# Patient Record
Sex: Male | Born: 1960
Health system: Southern US, Community
[De-identification: ages and names within clinical notes are randomized; demographics above are authoritative.]

## PROBLEM LIST (undated history)

## (undated) DIAGNOSIS — E785 Hyperlipidemia, unspecified: Secondary | ICD-10-CM

## (undated) DIAGNOSIS — I10 Essential (primary) hypertension: Secondary | ICD-10-CM

## (undated) HISTORY — PX: SHOULDER SURGERY: SHX246

## (undated) HISTORY — PX: HERNIA REPAIR: SHX51

---

## 2007-01-06 DIAGNOSIS — G4700 Insomnia, unspecified: Secondary | ICD-10-CM | POA: Insufficient documentation

## 2007-01-11 DIAGNOSIS — E785 Hyperlipidemia, unspecified: Secondary | ICD-10-CM | POA: Insufficient documentation

## 2007-01-11 DIAGNOSIS — IMO0002 Reserved for concepts with insufficient information to code with codable children: Secondary | ICD-10-CM | POA: Insufficient documentation

## 2007-05-20 ENCOUNTER — Emergency Department: Payer: Self-pay | Admitting: Emergency Medicine

## 2008-10-21 DIAGNOSIS — Z8249 Family history of ischemic heart disease and other diseases of the circulatory system: Secondary | ICD-10-CM | POA: Insufficient documentation

## 2012-02-02 ENCOUNTER — Emergency Department: Payer: Self-pay | Admitting: Emergency Medicine

## 2012-04-02 ENCOUNTER — Ambulatory Visit: Payer: Self-pay | Admitting: Emergency Medicine

## 2012-04-03 ENCOUNTER — Ambulatory Visit: Payer: Self-pay | Admitting: Emergency Medicine

## 2012-09-26 ENCOUNTER — Emergency Department: Payer: Self-pay | Admitting: Emergency Medicine

## 2013-01-13 ENCOUNTER — Emergency Department: Payer: Self-pay | Admitting: Internal Medicine

## 2014-05-08 ENCOUNTER — Ambulatory Visit: Payer: Self-pay | Admitting: Family Medicine

## 2014-05-08 LAB — CBC AND DIFFERENTIAL
HCT: 43 % (ref 41–53)
HEMOGLOBIN: 15 g/dL (ref 13.5–17.5)
Neutrophils Absolute: 3 /uL
Platelets: 253 10*3/uL (ref 150–399)
WBC: 5.7 10*3/mL

## 2014-05-08 LAB — LIPID PANEL
CHOLESTEROL: 216 mg/dL — AB (ref 0–200)
HDL: 71 mg/dL — AB (ref 35–70)
LDL Cholesterol: 122 mg/dL
Triglycerides: 117 mg/dL (ref 40–160)

## 2014-05-08 LAB — BASIC METABOLIC PANEL
BUN: 13 mg/dL (ref 4–21)
Creatinine: 1.3 mg/dL (ref 0.6–1.3)
Glucose: 92 mg/dL
POTASSIUM: 5 mmol/L (ref 3.4–5.3)
SODIUM: 138 mmol/L (ref 137–147)

## 2014-05-08 LAB — HEPATIC FUNCTION PANEL
ALT: 60 U/L — AB (ref 10–40)
AST: 45 U/L — AB (ref 14–40)
Alkaline Phosphatase: 57 U/L (ref 25–125)
Bilirubin, Total: 0.2 mg/dL

## 2014-07-11 NOTE — Op Note (Signed)
PATIENT NAME:  GARRIT, MARROW MR#:  [id number] DATE OF BIRTH:  [birth year]  DATE OF PROCEDURE:  04/03/2012  PREOPERATIVE DIAGNOSIS: Incarcerated ventral hernia.   POSTOPERATIVE DIAGNOSIS: Incarcerated ventral hernia.   PROCEDURES PERFORMED: 1. Repair of incarcerated ventral hernia.  2. Insertion of mesh.   SURGEON: Vella Kohler, MD  INDICATION: This is a patient who was having pain in the umbilical area and had an incarcerated hernia.   DESCRIPTION OF PROCEDURE: The patient was then brought to surgery. After he was put to sleep, a transverse incision was made. After cutting skin and subcutaneous tissue, the hernia was coming below the umbilicus. The umbilicus itself was okay. It was detached anyway and hernia sac was then removed, with 0 Vicryl sutures, after dissecting out from the peritoneum. There was a piece of omentum which was stuck. After that I took a piece of mesh and put it in the abdomen, brought out from the incision and then sutured with 0 Surgilon interrupted sutures. After this repair was done, which was very good, subcuticular closure with 3-0 Vicryl and 4-0 Vicryl was done and glue was applied. The patient tolerated the procedure well and was sent to the recovery room in satisfactory condition.  ____________________________ Welford Roche Phylis Bougie, MD msh:sb D: 04/03/2012 13:19:15 ET T: 04/03/2012 13:25:31 ET JOB#: [id number]  cc: Samul Mcinroy S. Phylis Bougie, MD, <Dictator> Meindert A. Brunetta Genera, MD Sharene Butters MD ELECTRONICALLY SIGNED 04/03/2012 16:27

## 2015-04-21 ENCOUNTER — Emergency Department
Admission: EM | Admit: 2015-04-21 | Discharge: 2015-04-21 | Disposition: A | Discharge status: Home / Self Care | Payer: 59 | Attending: Emergency Medicine | Admitting: Emergency Medicine

## 2015-04-21 ENCOUNTER — Encounter: Payer: Self-pay | Admitting: Emergency Medicine

## 2015-04-21 DIAGNOSIS — L02416 Cutaneous abscess of left lower limb: Secondary | ICD-10-CM | POA: Diagnosis present

## 2015-04-21 MED ORDER — LIDOCAINE HCL (PF) 1 % IJ SOLN
INTRAMUSCULAR | Status: AC
  Administered 2015-04-21: 15:00:00
  Filled 2015-04-21: qty 5

## 2015-04-21 MED ORDER — OXYCODONE-ACETAMINOPHEN 5-325 MG PO TABS
1.0000 | ORAL_TABLET | ORAL | Status: DC | PRN
Start: 2015-04-21 — End: 2015-05-18

## 2015-04-21 MED ORDER — SULFAMETHOXAZOLE-TRIMETHOPRIM 800-160 MG PO TABS: 1.0000 | ORAL_TABLET | Freq: Two times a day (BID) | ORAL | Status: DC

## 2015-04-21 NOTE — ED Provider Notes (Signed)
[facility] Emergency Department Provider Note  ____________________________________________  Time seen: Approximately 1:55 PM  I have reviewed the triage vital signs and the nursing notes.   HISTORY  Chief Complaint Leg Pain    HPI Anthony Hood is a 55 y.o. male presents for evaluation of abscess to his left upper thigh. He thinks a thorn might actually be stuck in his thigh or punctured Causing infection. Pain is considered 9/10 and nonradiating at this time. Relief with over-the-counter medications   History reviewed. No pertinent past medical history.  There are no active problems to display for this patient.   History reviewed. No pertinent past surgical history.  Current Outpatient Rx  Name  Route  Sig  Dispense  Refill  . oxyCODONE-acetaminophen (ROXICET) 5-325 MG tablet   Oral   Take 1-2 tablets by mouth every 4 (four) hours as needed for severe pain.   15 tablet   0   . sulfamethoxazole-trimethoprim (BACTRIM DS,SEPTRA DS) 800-160 MG tablet   Oral   Take 1 tablet by mouth 2 (two) times daily.   20 tablet   0     Allergies Review of patient's allergies indicates no known allergies.  History reviewed. No pertinent family history.  Social History Social History  Substance Use Topics  . Smoking status: Never Smoker   . Smokeless tobacco: None  . Alcohol Use: No    Review of Systems Constitutional: No fever/chills Eyes: No visual changes. ENT: No sore throat. Cardiovascular: Denies chest pain. Respiratory: Denies shortness of breath. Gastrointestinal: No abdominal pain.  No nausea, no vomiting.  No diarrhea.  No constipation. Genitourinary: Negative for dysuria. Musculoskeletal: Negative for back pain. Skin: Positive for erythema redness and tenderness. Neurological: Negative for headaches, focal weakness or numbness.  10-point ROS otherwise negative.  ____________________________________________   PHYSICAL  EXAM:  VITAL SIGNS: ED Triage Vitals  Enc Vitals Group     BP 04/21/15 1333 130/87 mmHg     Pulse Rate 04/21/15 1333 100     Resp 04/21/15 1333 15     Temp 04/21/15 1333 98.1 F (36.7 C)     Temp Source 04/21/15 1333 Oral     SpO2 04/21/15 1333 100 %     Weight 04/21/15 1333 201 lb (91.173 kg)     Height 04/21/15 1333 6\' 2"  (1.88 m)     Head Cir --      Peak Flow --      Pain Score 04/21/15 1330 9     Pain Loc --      Pain Edu? --      Excl. in [place]? --     Constitutional: Alert and oriented. Well appearing and in no acute distress. Cardiovascular: Normal rate, regular rhythm. Grossly normal heart sounds.  Good peripheral circulation. Respiratory: Normal respiratory effort.  No retractions. Lungs CTAB. Musculoskeletal: No lower extremity tenderness nor edema.  No joint effusions. Neurologic:  Normal speech and language. No gross focal neurologic deficits are appreciated. No gait instability. Skin:  Skin is warm and dry with a 10 cm erythematous area erythema and extreme warmth. A 2 cm punctate middle noted. With pustular fluid. Psychiatric: Mood and affect are normal. Speech and behavior are normal.  ____________________________________________   LABS (all labs ordered are listed, but only abnormal results are displayed)  Labs Reviewed - No data to display   RADIOLOGY  Deferred at this visit ____________________________________________   PROCEDURES  Procedure(s) performed: [place] Performed by: Arlyss Repress Consent:  Verbal consent obtained. Risks and benefits: risks, benefits and alternatives were discussed Type: abscess  Body area: Left upper thigh  Anesthesia: local infiltration  Incision was made with a scalpel.  Local anesthetic: lidocaine 1 % without epinephrine  Anesthetic total: 4 ml  Complexity: complex Blunt dissection to break up loculations  Drainage: purulent  Drainage amount: Moderate   Packing material: 1/4 in  iodoform gauze  Patient tolerance: Patient tolerated the procedure well with no immediate complications.      Critical Care performed: No  ____________________________________________   INITIAL IMPRESSION / ASSESSMENT AND PLAN / ED COURSE  Pertinent labs & imaging results that were available during my care of the patient were reviewed by me and considered in my medical decision making (see chart for details).  Abscess to left upper thigh. Rx given for Bactrim DS twice a day 20 and Percocet 5/325. Procedure performed as noted above. Patient to return in 48 hours for packing recheck. ____________________________________________   FINAL CLINICAL IMPRESSION(S) / ED DIAGNOSES  Final diagnoses:  Abscess of left thigh     This chart was dictated using voice recognition software/Dragon. Despite best efforts to proofread, errors can occur which can change the meaning. Any change was purely unintentional.   Arlyss Repress, PA-C 04/21/15 [place], MD 04/21/15 [phone]

## 2015-04-21 NOTE — ED Notes (Signed)
Pt to ed with c/o upper left leg pain x 2 days.  Pt states he thinks there may be a thorn in his leg.

## 2015-04-21 NOTE — Discharge Instructions (Signed)
Incision and Drainage °Incision and drainage is a procedure in which a sac-like structure (cystic structure) is opened and drained. The area to be drained usually contains material such as pus, fluid, or blood.  °LET YOUR CAREGIVER KNOW ABOUT:  °· Allergies to medicine. °· Medicines taken, including vitamins, herbs, eyedrops, over-the-counter medicines, and creams. °· Use of steroids (by mouth or creams). °· Previous problems with anesthetics or numbing medicines. °· History of bleeding problems or blood clots. °· Previous surgery. °· Other health problems, including diabetes and kidney problems. °· Possibility of pregnancy, if this applies. °RISKS AND COMPLICATIONS °· Pain. °· Bleeding. °· Scarring. °· Infection. °BEFORE THE PROCEDURE  °You may need to have an ultrasound or other imaging tests to see how large or deep your cystic structure is. Blood tests may also be used to determine if you have an infection or how severe the infection is. You may need to have a tetanus shot. °PROCEDURE  °The affected area is cleaned with a cleaning fluid. The cyst area will then be numbed with a medicine (local anesthetic). A small incision will be made in the cystic structure. A syringe or catheter may be used to drain the contents of the cystic structure, or the contents may be squeezed out. The area will then be flushed with a cleansing solution. After cleansing the area, it is often gently packed with a gauze or another wound dressing. Once it is packed, it will be covered with gauze and tape or some other type of wound dressing.  °AFTER THE PROCEDURE  °· Often, you will be allowed to go home right after the procedure. °· You may be given antibiotic medicine to prevent or heal an infection. °· If the area was packed with gauze or some other wound dressing, you will likely need to come back in 1 to 2 days to get it removed. °· The area should heal in about 14 days. °  °This information is not intended to replace advice given  to you by your health care provider. Make sure you discuss any questions you have with your health care provider. °  °Document Released: 08/31/2000 Document Revised: 09/06/2011 Document Reviewed: 05/02/2011 °Elsevier Interactive Patient Education ©2016 Elsevier Inc. ° °Abscess °An abscess is an infected area that contains a collection of pus and debris. It can occur in almost any part of the body. An abscess is also known as a furuncle or boil. °CAUSES  °An abscess occurs when tissue gets infected. This can occur from blockage of oil or sweat glands, infection of hair follicles, or a minor injury to the skin. As the body tries to fight the infection, pus collects in the area and creates pressure under the skin. This pressure causes pain. People with weakened immune systems have difficulty fighting infections and get certain abscesses more often.  °SYMPTOMS °Usually an abscess develops on the skin and becomes a painful mass that is red, warm, and tender. If the abscess forms under the skin, you may feel a moveable soft area under the skin. Some abscesses break open (rupture) on their own, but most will continue to get worse without care. The infection can spread deeper into the body and eventually into the bloodstream, causing you to feel ill.  °DIAGNOSIS  °Your caregiver will take your medical history and perform a physical exam. A sample of fluid may also be taken from the abscess to determine what is causing your infection. °TREATMENT  °Your caregiver may prescribe antibiotic medicines to fight the infection.   However, taking antibiotics alone usually does not cure an abscess. Your caregiver may need to make a small cut (incision) in the abscess to drain the pus. In some cases, gauze is packed into the abscess to reduce pain and to continue draining the area. °HOME CARE INSTRUCTIONS  °· Only take over-the-counter or prescription medicines for pain, discomfort, or fever as directed by your caregiver. °· If you were  prescribed antibiotics, take them as directed. Finish them even if you start to feel better. °· If gauze is used, follow your caregiver's directions for changing the gauze. °· To avoid spreading the infection: °¨ Keep your draining abscess covered with a bandage. °¨ Wash your hands well. °¨ Do not share personal care items, towels, or whirlpools with others. °¨ Avoid skin contact with others. °· Keep your skin and clothes clean around the abscess. °· Keep all follow-up appointments as directed by your caregiver. °SEEK MEDICAL CARE IF:  °· You have increased pain, swelling, redness, fluid drainage, or bleeding. °· You have muscle aches, chills, or a general ill feeling. °· You have a fever. °MAKE SURE YOU:  °· Understand these instructions. °· Will watch your condition. °· Will get help right away if you are not doing well or get worse. °  °This information is not intended to replace advice given to you by your health care provider. Make sure you discuss any questions you have with your health care provider. °  °Document Released: 12/15/2004 Document Revised: 09/06/2011 Document Reviewed: 05/20/2011 °Elsevier Interactive Patient Education ©2016 Elsevier Inc. ° °

## 2015-04-21 NOTE — ED Notes (Signed)
Pt with left thigh wound (abscess). Warm, red painful.

## 2015-04-24 ENCOUNTER — Emergency Department
Admission: EM | Admit: 2015-04-24 | Discharge: 2015-04-24 | Disposition: A | Discharge status: Home / Self Care | Payer: 59

## 2015-04-24 DIAGNOSIS — K921 Melena: Secondary | ICD-10-CM | POA: Insufficient documentation

## 2015-04-24 DIAGNOSIS — I10 Essential (primary) hypertension: Secondary | ICD-10-CM | POA: Insufficient documentation

## 2015-04-24 DIAGNOSIS — R748 Abnormal levels of other serum enzymes: Secondary | ICD-10-CM | POA: Insufficient documentation

## 2015-05-18 ENCOUNTER — Ambulatory Visit (INDEPENDENT_AMBULATORY_CARE_PROVIDER_SITE_OTHER): Payer: 59 | Admitting: Family Medicine

## 2015-05-18 ENCOUNTER — Encounter: Payer: Self-pay | Admitting: Family Medicine

## 2015-05-18 VITALS — BP 120/80 | HR 84 | Temp 97.7°F | Resp 16 | Ht 72.0 in | Wt 203.0 lb

## 2015-05-18 DIAGNOSIS — Z0000 Encounter for general adult medical examination without abnormal findings: Secondary | ICD-10-CM

## 2015-05-18 DIAGNOSIS — E785 Hyperlipidemia, unspecified: Secondary | ICD-10-CM

## 2015-05-18 DIAGNOSIS — Z1211 Encounter for screening for malignant neoplasm of colon: Secondary | ICD-10-CM

## 2015-05-18 DIAGNOSIS — I10 Essential (primary) hypertension: Secondary | ICD-10-CM

## 2015-05-18 MED ORDER — ATORVASTATIN CALCIUM 20 MG PO TABS: 20.0000 mg | ORAL_TABLET | Freq: Every day | ORAL | Status: DC

## 2015-05-18 MED ORDER — ENALAPRIL MALEATE 5 MG PO TABS: 5.0000 mg | ORAL_TABLET | Freq: Every day | ORAL | Status: DC

## 2015-05-18 NOTE — Progress Notes (Signed)
Patient ID: Anthony Hood, male   DOB: [birth year], 55 y.o.   MRN: [id number]       Patient: Anthony Hood, Male    DOB: [birth year], 55 y.o.   MRN: [id number] Visit Date: 05/18/2015  Today's Provider: Margarita Rana, MD   Chief Complaint  Patient presents with  . Medicare Wellness   Subjective:    Annual wellness visit Anthony Hood is a 55 y.o. male. He feels well. He reports exercising 2 days a week 4. He reports he is sleeping well.  Did have adenoma and hyperplastic polyp.  Does not know when due for next colonoscopy.    -----------------------------------------------------------   Review of Systems  Constitutional: Negative.   HENT: Negative.   Eyes: Negative.   Respiratory: Negative.   Cardiovascular: Negative.   Gastrointestinal: Negative.   Endocrine: Negative.   Genitourinary: Negative.   Musculoskeletal: Negative.   Skin: Negative.   Allergic/Immunologic: Negative.   Neurological: Negative.   Hematological: Negative.   Psychiatric/Behavioral: Negative.     Social History   Social History  . Marital Status: Married    Spouse Name: N/A  . Number of Children: N/A  . Years of Education: N/A   Occupational History  . Not on file.   Social History Main Topics  . Smoking status: Never Smoker   . Smokeless tobacco: Never Used  . Alcohol Use: 9.0 oz/week    15 Cans of beer per week  . Drug Use: No  . Sexual Activity: Not on file   Other Topics Concern  . Not on file   Social History Narrative    History reviewed. No pertinent past medical history.   Patient Active Problem List   Diagnosis Date Noted  . Blood in feces 04/24/2015  . Abnormal liver enzymes 04/24/2015  . Essential (primary) hypertension 04/24/2015  . Fam hx-ischem heart disease 10/21/2008  . Elevation of level of transaminase or lactic acid dehydrogenase (LDH) 01/11/2007  . HLD (hyperlipidemia) 01/11/2007  . Cannot sleep 01/06/2007    Past Surgical History  Procedure  Laterality Date  . Hernia repair      umbilical  . Shoulder surgery      His family history includes Heart disease in his father; Hypertension in his sister; Seizures in his sister.    Previous Medications   ATORVASTATIN (LIPITOR) 20 MG TABLET    Take 20 mg by mouth daily at 6 PM.    ENALAPRIL (VASOTEC) 5 MG TABLET    Take 5 mg by mouth daily.     Patient Care Team: Lorelee Market, MD as PCP - General (Family Medicine)     Objective:   Vitals: BP 120/80 mmHg  Pulse 84  Temp(Src) 97.7 F (36.5 C)  Resp 16  Ht 6' (1.829 m)  Wt 203 lb (92.08 kg)  BMI 27.53 kg/m2  Physical Exam  Constitutional: He is oriented to person, place, and time. He appears well-developed and well-nourished.  HENT:  Head: Normocephalic and atraumatic.  Right Ear: External ear normal.  Left Ear: External ear normal.  Nose: Nose normal.  Mouth/Throat: Oropharynx is clear and moist.  Eyes: Conjunctivae and EOM are normal. Pupils are equal, round, and reactive to light.  Neck: Normal range of motion. Neck supple.  Cardiovascular: Normal rate, regular rhythm and normal heart sounds.   Pulmonary/Chest: Effort normal and breath sounds normal.  Abdominal: Soft. Bowel sounds are normal.  Musculoskeletal: Normal range of motion.  Neurological: He is alert and oriented to person, place, and time.  Skin: Skin is warm and dry.  Psychiatric: He has a normal mood and affect. His behavior is normal. Judgment and thought content normal.    Activities of Daily Living In your present state of health, do you have any difficulty performing the following activities: 05/18/2015  Hearing? N  Vision? Y  Difficulty concentrating or making decisions? N  Walking or climbing stairs? N  Dressing or bathing? N  Doing errands, shopping? N    Fall Risk Assessment Fall Risk  05/18/2015  Falls in the past year? No     Depression Screen PHQ 2/9 Scores 05/18/2015  PHQ - 2 Score 0    Cognitive Testing - 6-CIT   Correct? Score   What year is it? yes 0 0 or 4  What month is it? yes 0 0 or 3  Memorize:    Anthony Hood,  41,  [place] [address]  [place]      What time is it? (within 1 hour) yes 0 0 or 3  Count backwards from 20 yes 0 0, 2, or 4  Name the months of the year no 4 0, 2, or 4  Repeat name & address above no 2 0, 2, 4, 6, 8, or 10       TOTAL SCORE  6/28   Interpretation:  Normal  Normal (0-7) Abnormal (8-28)       Assessment & Plan:     Annual Wellness Visit  Reviewed patient's Family Medical History Reviewed and updated list of patient's medical providers Assessment of cognitive impairment was done Assessed patient's functional ability Established a written schedule for health screening [place] Completed and Reviewed  Exercise Activities and Dietary recommendations Goals    None     1. Medicare annual wellness visit, subsequent Stable as above.    2. Essential (primary) hypertension Condition is stable. Please continue current medication and  plan of care as noted.   - CBC with Differential/Platelet - Comprehensive metabolic panel - enalapril (VASOTEC) 5 MG tablet; Take 1 tablet (5 mg total) by mouth daily.  Dispense: 90 tablet; Refill: 3  3. HLD (hyperlipidemia) Condition is stable. Please continue current medication and  plan of care as noted.   - Lipid Panel With LDL/HDL Ratio - atorvastatin (LIPITOR) 20 MG tablet; Take 1 tablet (20 mg total) by mouth daily at 6 PM.  Dispense: 90 tablet; Refill: 3  4. Colon cancer screening History of abnormal colonoscopy in 2013.  Will refer for repeat.   - Ambulatory referral to Gastroenterology      Patient was seen and examined by Jerrell Belfast, MD, and note scribed by Lynford Humphrey, [place].   I have reviewed the document for accuracy and completeness and I agree with above. Jerrell Belfast, MD   Margarita Rana, MD    ------------------------------------------------------------------------------------------------------------

## 2015-05-19 DIAGNOSIS — I10 Essential (primary) hypertension: Secondary | ICD-10-CM | POA: Diagnosis not present

## 2015-05-19 DIAGNOSIS — E785 Hyperlipidemia, unspecified: Secondary | ICD-10-CM | POA: Diagnosis not present

## 2015-05-20 LAB — CBC WITH DIFFERENTIAL/PLATELET
Basophils Absolute: 0 x10E3/uL (ref 0.0–0.2)
Basos: 1 %
EOS (ABSOLUTE): 0.1 x10E3/uL (ref 0.0–0.4)
Eos: 2 %
Hematocrit: 40.4 % (ref 37.5–51.0)
Hemoglobin: 14 g/dL (ref 12.6–17.7)
Immature Grans (Abs): 0 x10E3/uL (ref 0.0–0.1)
Immature Granulocytes: 0 %
Lymphocytes Absolute: 1.4 x10E3/uL (ref 0.7–3.1)
Lymphs: 34 %
MCH: 29.8 pg (ref 26.6–33.0)
MCHC: 34.7 g/dL (ref 31.5–35.7)
MCV: 86 fL (ref 79–97)
Monocytes Absolute: 0.3 x10E3/uL (ref 0.1–0.9)
Monocytes: 8 %
Neutrophils Absolute: 2.3 x10E3/uL (ref 1.4–7.0)
Neutrophils: 55 %
Platelets: 249 x10E3/uL (ref 150–379)
RBC: 4.7 x10E6/uL (ref 4.14–5.80)
RDW: 13.7 % (ref 12.3–15.4)
WBC: 4.1 x10E3/uL (ref 3.4–10.8)

## 2015-05-20 LAB — COMPREHENSIVE METABOLIC PANEL WITH GFR
ALT: 37 IU/L (ref 0–44)
AST: 34 IU/L (ref 0–40)
Albumin/Globulin Ratio: 1.4 (ref 1.1–2.5)
Albumin: 4.2 g/dL (ref 3.5–5.5)
Alkaline Phosphatase: 50 IU/L (ref 39–117)
BUN/Creatinine Ratio: 10 (ref 9–20)
BUN: 12 mg/dL (ref 6–24)
Bilirubin Total: 0.4 mg/dL (ref 0.0–1.2)
CO2: 19 mmol/L (ref 18–29)
Calcium: 9.3 mg/dL (ref 8.7–10.2)
Chloride: 100 mmol/L (ref 96–106)
Creatinine, Ser: 1.15 mg/dL (ref 0.76–1.27)
GFR calc Af Amer: 82 mL/min/1.73
GFR calc non Af Amer: 71 mL/min/1.73
Globulin, Total: 3 g/dL (ref 1.5–4.5)
Glucose: 96 mg/dL (ref 65–99)
Potassium: 4.5 mmol/L (ref 3.5–5.2)
Sodium: 138 mmol/L (ref 134–144)
Total Protein: 7.2 g/dL (ref 6.0–8.5)

## 2015-05-20 LAB — LIPID PANEL WITH LDL/HDL RATIO
Cholesterol, Total: 275 mg/dL — ABNORMAL HIGH (ref 100–199)
HDL: 76 mg/dL (ref >39)
LDL Calculated: 172 mg/dL — ABNORMAL HIGH (ref 0–99)
LDl/HDL Ratio: 2.3 ratio units (ref 0.0–3.6)
TRIGLYCERIDES: 136 mg/dL (ref 0–149)
VLDL CHOLESTEROL CAL: 27 mg/dL (ref 5–40)

## 2015-05-22 ENCOUNTER — Telehealth: Payer: Self-pay

## 2015-05-22 NOTE — Telephone Encounter (Signed)
-----   Message from Margarita Rana, MD sent at 05/22/2015 12:26 PM EST ----- Labs stable except cholesterol is too high.  Please see if patient taking his medication regularly. Thanks.

## 2015-05-22 NOTE — Telephone Encounter (Signed)
Left message with wife for patient to call back.

## 2015-05-25 NOTE — Telephone Encounter (Signed)
Tammy asked for a nurse to call pt back to give him the results of his labs. Thanks TNP

## 2015-05-25 NOTE — Telephone Encounter (Signed)
Informed pt. States he is NOT taking medication regularly. Advised pt to take cholesterol medication every night to help lower lipids. Renaldo Fiddler, CMA

## 2015-07-17 ENCOUNTER — Encounter: Payer: Self-pay | Admitting: *Deleted

## 2015-07-20 ENCOUNTER — Encounter
Admission: RE | Disposition: A | Discharge status: Home / Self Care | Payer: Self-pay | Source: Ambulatory Visit | Attending: Unknown Physician Specialty

## 2015-07-20 ENCOUNTER — Ambulatory Visit: Payer: Medicare Other | Admitting: Certified Registered Nurse Anesthetist

## 2015-07-20 ENCOUNTER — Encounter: Payer: Self-pay | Admitting: *Deleted

## 2015-07-20 ENCOUNTER — Ambulatory Visit
Admission: RE | Admit: 2015-07-20 | Discharge: 2015-07-20 | Disposition: A | Discharge status: Home / Self Care | Payer: Medicare Other | Source: Ambulatory Visit | Attending: Unknown Physician Specialty | Admitting: Unknown Physician Specialty

## 2015-07-20 DIAGNOSIS — D124 Benign neoplasm of descending colon: Secondary | ICD-10-CM | POA: Insufficient documentation

## 2015-07-20 DIAGNOSIS — Z820 Family history of epilepsy and other diseases of the nervous system: Secondary | ICD-10-CM | POA: Insufficient documentation

## 2015-07-20 DIAGNOSIS — I10 Essential (primary) hypertension: Secondary | ICD-10-CM | POA: Diagnosis not present

## 2015-07-20 DIAGNOSIS — Z86010 Personal history of colonic polyps: Secondary | ICD-10-CM | POA: Diagnosis not present

## 2015-07-20 DIAGNOSIS — D125 Benign neoplasm of sigmoid colon: Secondary | ICD-10-CM | POA: Diagnosis not present

## 2015-07-20 DIAGNOSIS — K635 Polyp of colon: Secondary | ICD-10-CM | POA: Diagnosis not present

## 2015-07-20 DIAGNOSIS — K5730 Diverticulosis of large intestine without perforation or abscess without bleeding: Secondary | ICD-10-CM | POA: Diagnosis not present

## 2015-07-20 DIAGNOSIS — K5790 Diverticulosis of intestine, part unspecified, without perforation or abscess without bleeding: Secondary | ICD-10-CM | POA: Diagnosis not present

## 2015-07-20 DIAGNOSIS — Z79899 Other long term (current) drug therapy: Secondary | ICD-10-CM | POA: Diagnosis not present

## 2015-07-20 DIAGNOSIS — E785 Hyperlipidemia, unspecified: Secondary | ICD-10-CM | POA: Insufficient documentation

## 2015-07-20 DIAGNOSIS — D122 Benign neoplasm of ascending colon: Secondary | ICD-10-CM | POA: Diagnosis not present

## 2015-07-20 DIAGNOSIS — Z8249 Family history of ischemic heart disease and other diseases of the circulatory system: Secondary | ICD-10-CM | POA: Insufficient documentation

## 2015-07-20 DIAGNOSIS — Z1211 Encounter for screening for malignant neoplasm of colon: Secondary | ICD-10-CM | POA: Insufficient documentation

## 2015-07-20 DIAGNOSIS — K649 Unspecified hemorrhoids: Secondary | ICD-10-CM | POA: Diagnosis not present

## 2015-07-20 HISTORY — PX: COLONOSCOPY WITH PROPOFOL: SHX5780

## 2015-07-20 HISTORY — DX: Hyperlipidemia, unspecified: E78.5

## 2015-07-20 HISTORY — DX: Essential (primary) hypertension: I10

## 2015-07-20 LAB — SURGICAL PATHOLOGY

## 2015-07-20 SURGERY — COLONOSCOPY WITH PROPOFOL
Anesthesia: General

## 2015-07-20 MED ORDER — MIDAZOLAM HCL 2 MG/2ML IJ SOLN
INTRAMUSCULAR | Status: DC | PRN
  Administered 2015-07-20: 1 mg via INTRAVENOUS

## 2015-07-20 MED ORDER — SODIUM CHLORIDE 0.9 % IV SOLN
INTRAVENOUS | Status: DC
  Administered 2015-07-20: 14:00:00 via INTRAVENOUS

## 2015-07-20 MED ORDER — SODIUM CHLORIDE 0.9 % IV SOLN: INTRAVENOUS | Status: DC

## 2015-07-20 MED ORDER — PROPOFOL 10 MG/ML IV BOLUS
INTRAVENOUS | Status: DC | PRN
Start: 2015-07-20 — End: 2015-07-20
  Administered 2015-07-20: 30 mg via INTRAVENOUS

## 2015-07-20 MED ORDER — PHENYLEPHRINE HCL 10 MG/ML IJ SOLN
INTRAMUSCULAR | Status: DC | PRN
  Administered 2015-07-20: 100 ug via INTRAVENOUS

## 2015-07-20 MED ORDER — PROPOFOL 500 MG/50ML IV EMUL
INTRAVENOUS | Status: DC | PRN
  Administered 2015-07-20: 140 ug/kg/min via INTRAVENOUS

## 2015-07-20 MED ORDER — LIDOCAINE HCL (CARDIAC) 20 MG/ML IV SOLN
INTRAVENOUS | Status: DC | PRN
  Administered 2015-07-20: 60 mg via INTRAVENOUS

## 2015-07-20 NOTE — Transfer of Care (Signed)
Immediate Anesthesia Transfer of Care Note  Patient: Anthony Hood  Procedure(s) Performed: Procedure(s): COLONOSCOPY WITH PROPOFOL (N/A)  Patient Location: PACU  Anesthesia Type:General  Level of Consciousness: sedated  Airway & Oxygen Therapy: Patient Spontanous Breathing and Patient connected to nasal cannula oxygen  Post-op Assessment: Report given to RN and Post -op Vital signs reviewed and stable  Post vital signs: Reviewed and stable  Last Vitals:  Filed Vitals:   07/20/15 1345 07/20/15 1440  BP: 140/94 100/64  Pulse: 65 52  Temp: 36.3 C 35.8 C  Resp: 16 18    Last Pain: There were no vitals filed for this visit.       Complications: No apparent anesthesia complications

## 2015-07-20 NOTE — Op Note (Signed)
[facility] Patient Name: Anthony Hood Procedure Date: 07/20/2015 2:05 PM MRN: [id number] Account #: [id number] Date of Birth: 11-24-1960 Admit Type: Outpatient Age: 54 Room: [facility] ENDO ROOM 1 Gender: Male Note Status: Finalized Procedure:            Colonoscopy Indications:          High risk colon cancer surveillance: Personal history                        of colonic polyps Providers:            Manya Silvas, MD Referring MD:         Jerrell Belfast, MD (Referring MD) Medicines:            Propofol per Anesthesia Complications:        No immediate complications. Procedure:            Pre-Anesthesia Assessment:                       - After reviewing the risks and benefits, the patient                        was deemed in satisfactory condition to undergo the                        procedure.                       After obtaining informed consent, the colonoscope was                        passed under direct vision. Throughout the procedure,                        the patient's blood pressure, pulse, and oxygen                        saturations were monitored continuously. The                        Colonoscope was introduced through the anus and                        advanced to the the cecum, identified by appendiceal                        orifice and ileocecal valve. The colonoscopy was                        performed without difficulty. The patient tolerated the                        procedure well. The quality of the bowel preparation                        was good. Findings:      Two sessile polyps were found in the ascending colon. The polyps were       diminutive in size. These polyps were removed with a jumbo cold forceps.       Resection and retrieval were complete.      Two sessile  polyps were found in the descending colon. The polyps were       diminutive in size. These polyps were removed with a jumbo cold forceps.       Resection and retrieval were complete.      Three sessile polyps were found in the sigmoid colon. The polyps were       diminutive in size. These polyps were removed with a jumbo cold forceps.       Resection and retrieval were complete.      A few small-mouthed diverticula were found in the descending colon and       transverse colon. Impression:           - Two diminutive polyps in the ascending colon, removed                        with a jumbo cold forceps. Resected and retrieved.                       - Two diminutive polyps in the descending colon,                        removed with a jumbo cold forceps. Resected and                        retrieved.                       - Three diminutive polyps in the sigmoid colon, removed                        with a jumbo cold forceps. Resected and retrieved. Recommendation:       - Await pathology results. Manya Silvas, MD 07/20/2015 2:37:30 PM This report has been signed electronically. Number of Addenda: 0 Note Initiated On: 07/20/2015 2:05 PM Scope Withdrawal Time: 0 hours 19 minutes 6 seconds  Total Procedure Duration: 0 hours 22 minutes 57 seconds       [facility]

## 2015-07-20 NOTE — Anesthesia Procedure Notes (Signed)
Date/Time: 07/20/2015 2:08 PM Performed by: Johnna Acosta Pre-anesthesia Checklist: Patient identified, Emergency Drugs available, Suction available, Patient being monitored and Timeout performed Patient Re-evaluated:Patient Re-evaluated prior to inductionOxygen Delivery Method: Nasal cannula

## 2015-07-20 NOTE — H&P (Signed)
   Primary Care Physician:  Lelon Huh, MD Primary Gastroenterologist:  Dr. Vira Agar  Pre-Procedure History & Physical: HPI:  Anthony Hood is a 55 y.o. male is here for an colonoscopy.   Past Medical History  Diagnosis Date  . Hypertension   . Hyperlipidemia     Past Surgical History  Procedure Laterality Date  . Hernia repair      umbilical  . Shoulder surgery      Prior to Admission medications   Medication Sig Start Date End Date Taking? Authorizing Provider  atorvastatin (LIPITOR) 20 MG tablet Take 1 tablet (20 mg total) by mouth daily at 6 PM. 05/18/15  Yes Margarita Rana, MD  enalapril (VASOTEC) 5 MG tablet Take 1 tablet (5 mg total) by mouth daily. 05/18/15  Yes Margarita Rana, MD    Allergies as of 07/08/2015  . (No Known Allergies)    Family History  Problem Relation Age of Onset  . Heart disease Father   . Hypertension Sister   . Seizures Sister     Social History   Social History  . Marital Status: Married    Spouse Name: N/A  . Number of Children: N/A  . Years of Education: N/A   Occupational History  . Not on file.   Social History Main Topics  . Smoking status: Never Smoker   . Smokeless tobacco: Never Used  . Alcohol Use: 9.0 oz/week    15 Cans of beer per week  . Drug Use: No  . Sexual Activity: Not on file   Other Topics Concern  . Not on file   Social History Narrative    Review of Systems: See HPI, otherwise negative ROS  Physical Exam: BP 140/94 mmHg  Pulse 65  Temp(Src) 97.4 F (36.3 C) (Tympanic)  Resp 16  Ht 6' (1.829 m)  Wt 97.07 kg (214 lb)  BMI 29.02 kg/m2  SpO2 100% General:   Alert,  pleasant and cooperative in NAD Head:  Normocephalic and atraumatic. Neck:  Supple; no masses or thyromegaly. Lungs:  Clear throughout to auscultation.    Heart:  Regular rate and rhythm. Abdomen:  Soft, nontender and nondistended. Normal bowel sounds, without guarding, and without rebound.   Neurologic:  Alert and  oriented x4;   grossly normal neurologically.  Impression/Plan: Anthony Hood is here for an colonoscopy to be performed for [facility] colon polyps  Risks, benefits, limitations, and alternatives regarding  colonoscopy have been reviewed with the patient.  Questions have been answered.  All parties agreeable.   Gaylyn Cheers, MD  07/20/2015, 2:02 PM

## 2015-07-21 ENCOUNTER — Ambulatory Visit: Payer: Medicare Other | Admitting: Family Medicine

## 2015-07-21 ENCOUNTER — Encounter: Payer: Self-pay | Admitting: Unknown Physician Specialty

## 2015-07-21 ENCOUNTER — Telehealth: Payer: Self-pay | Admitting: Family Medicine

## 2015-07-21 NOTE — Telephone Encounter (Signed)
Pt called states he had an appointment with Simona Huh today and the wait was too long so he was not seen.  Pt is asking if he can start see another doctor in the practice.  Will you start seeing this patient?  CB#[phone]

## 2015-07-21 NOTE — Progress Notes (Deleted)
Patient ID: Lam Hicken, male   DOB: [birth year], 55 y.o.   MRN: [id number]   Patient: Tyshan Zingsheim Male    DOB: April 23, 1960   55 y.o.   MRN: [id number] Visit Date: 07/21/2015  Today's Provider: Vernie Murders, PA   Chief Complaint  Patient presents with  . Epistaxis   Subjective:    Epistaxis  The bleeding has been from the right nare. This is a new problem. The current episode started 1 to 4 weeks ago. The problem occurs every several days. The problem has been unchanged. The bleeding is associated with nothing. He has tried nasal tampon for the symptoms. The treatment provided significant relief.    Past Medical History  Diagnosis Date  . Hypertension   . Hyperlipidemia    Patient Active Problem List   Diagnosis Date Noted  . Blood in feces 04/24/2015  . Abnormal liver enzymes 04/24/2015  . Essential (primary) hypertension 04/24/2015  . Fam hx-ischem heart disease 10/21/2008  . Elevation of level of transaminase or lactic acid dehydrogenase (LDH) 01/11/2007  . HLD (hyperlipidemia) 01/11/2007  . Cannot sleep 01/06/2007   Past Surgical History  Procedure Laterality Date  . Hernia repair      umbilical  . Shoulder surgery    . Colonoscopy with propofol N/A 07/20/2015    Procedure: COLONOSCOPY WITH PROPOFOL;  Surgeon: Manya Silvas, MD;  Location: [facility] ENDOSCOPY;  Service: Endoscopy;  Laterality: N/A;   Family History  Problem Relation Age of Onset  . Heart disease Father   . Hypertension Sister   . Seizures Sister    Previous Medications   ATORVASTATIN (LIPITOR) 20 MG TABLET    Take 1 tablet (20 mg total) by mouth daily at 6 PM.   ENALAPRIL (VASOTEC) 5 MG TABLET    Take 1 tablet (5 mg total) by mouth daily.   No Known Allergies  Review of Systems  Constitutional: Negative.   HENT: Positive for nosebleeds.   Eyes: Negative.   Respiratory: Negative.   Cardiovascular: Negative.   Gastrointestinal: Negative.   Endocrine: Negative.   Genitourinary: Negative.     Musculoskeletal: Negative.   Skin: Negative.   Allergic/Immunologic: Negative.   Neurological: Negative.   Hematological: Negative.   Psychiatric/Behavioral: Negative.     Social History  Substance Use Topics  . Smoking status: Never Smoker   . Smokeless tobacco: Never Used  . Alcohol Use: 9.0 oz/week    15 Cans of beer per week   Objective:   BP 128/86 mmHg  Pulse 57  Temp(Src) 97.8 F (36.6 C) (Oral)  Resp 14  Wt 208 lb (94.348 kg)  Physical Exam      Assessment & Plan:

## 2015-07-21 NOTE — Anesthesia Postprocedure Evaluation (Signed)
Anesthesia Post Note  Patient: Anthony Hood  Procedure(s) Performed: Procedure(s) (LRB): COLONOSCOPY WITH PROPOFOL (N/A)  Patient location during evaluation: PACU Anesthesia Type: General Level of consciousness: awake and alert Pain management: pain level controlled Vital Signs Assessment: post-procedure vital signs reviewed and stable Respiratory status: spontaneous breathing, nonlabored ventilation, respiratory function stable and patient connected to nasal cannula oxygen Cardiovascular status: blood pressure returned to baseline and stable Postop Assessment: no signs of nausea or vomiting Anesthetic complications: no    Last Vitals:  Filed Vitals:   07/20/15 1500 07/20/15 1510  BP: 114/89 130/87  Pulse: 58 56  Temp:    Resp: 18 16    Last Pain: There were no vitals filed for this visit.               Molli Barrows

## 2015-07-21 NOTE — Anesthesia Preprocedure Evaluation (Signed)
Anesthesia Evaluation  Patient identified by MRN, date of birth, ID band Patient awake    Reviewed: Allergy & Precautions, H&P , NPO status , Patient's Chart, lab work & pertinent test results, reviewed documented beta blocker date and time   Airway Mallampati: II   Neck ROM: full    Dental  (+) Teeth Intact   Pulmonary neg pulmonary ROS,    Pulmonary exam normal        Cardiovascular hypertension, negative cardio ROS Normal cardiovascular exam     Neuro/Psych negative neurological ROS  negative psych ROS   GI/Hepatic negative GI ROS, Neg liver ROS,   Endo/Other  negative endocrine ROS  Renal/GU negative Renal ROS  negative genitourinary   Musculoskeletal   Abdominal   Peds  Hematology negative hematology ROS (+)   Anesthesia Other Findings Past Medical History:   Hypertension                                                 Hyperlipidemia                                             Past Surgical History:   HERNIA REPAIR                                                   Comment:umbilical   SHOULDER SURGERY                                              COLONOSCOPY WITH PROPOFOL                       N/A 07/20/2015       Comment:Procedure: COLONOSCOPY WITH PROPOFOL;  Surgeon:              Manya Silvas, MD;  Location: [facility]               ENDOSCOPY;  Service: Endoscopy;  Laterality:               N/A; BMI    Body Mass Index   29.01 kg/m 2     Reproductive/Obstetrics                             Anesthesia Physical Anesthesia Plan  ASA: II  Anesthesia Plan: General   Post-op Pain Management:    Induction:   Airway Management Planned:   Additional Equipment:   Intra-op Plan:   Post-operative Plan:   Informed Consent: I have reviewed the patients History and Physical, chart, labs and discussed the procedure including the risks, benefits and alternatives for the proposed  anesthesia with the patient or authorized representative who has indicated his/her understanding and acceptance.   Dental Advisory Given  Plan Discussed with: CRNA  Anesthesia Plan Comments:         Anesthesia Quick Evaluation

## 2015-07-22 NOTE — Telephone Encounter (Signed)
Before I say yes I need to know what specifically he was disgruntled over and whether it has been dealt with. He has consistently seen Simona Huh for routine follow up and Dr. Venia Minks for a physical this year.

## 2015-07-22 NOTE — Telephone Encounter (Signed)
Anthony Hood is aware that pt was upset and left before being seen.  I did talk with [name] about pt yesterday and she was going to talk with Anthony Hood.  Pt was here for a nose bleed/MW

## 2015-09-07 NOTE — Progress Notes (Signed)
Checked in by left without being seen.

## 2016-03-01 ENCOUNTER — Ambulatory Visit (INDEPENDENT_AMBULATORY_CARE_PROVIDER_SITE_OTHER): Payer: Medicare Other | Admitting: Family Medicine

## 2016-03-01 ENCOUNTER — Encounter: Payer: Self-pay | Admitting: Family Medicine

## 2016-03-01 VITALS — BP 114/82 | HR 72 | Temp 98.3°F | Resp 16 | Wt 205.6 lb

## 2016-03-01 DIAGNOSIS — R202 Paresthesia of skin: Secondary | ICD-10-CM

## 2016-03-01 NOTE — Progress Notes (Signed)
Subjective:     Patient ID: Anthony Hood, male   DOB: 08/06/1960, 55 y.o.   MRN: [id number]  HPI  Chief Complaint  Patient presents with  . Tingling    Patient comes in office today with concerns of tingling and tightness in his left arm for the past 2-3 weeks. Patient describes feeling as needles going up his arm, patient sttes that pain has been radiating down to his finger tips.   Reports primarily 4th and fifth fingers involvement but can also involve his second and third fingers. States it goes up the side of his arm and makes his muscles tight. Denies specific injury. Not working due to cognitive disability but does mow his yard and does housework. States discomfort does not bother him enough to take medication.   Review of Systems     Objective:   Physical Exam  Constitutional: He appears well-developed and well-nourished. No distress.  Musculoskeletal:  Left grip/ arm/shoulder with 5/5 strength. Cervical FROM without pain. Phalen's positive for 4th and 5th finger  paresthesias moving up his left lateral arm.       Assessment:    1. Paresthesias in left hand:Refer for probable EMG/NCV - Ambulatory referral to Neurology    Plan:    Further f/u pending neurology appointment.

## 2016-03-01 NOTE — Patient Instructions (Signed)
We will call you with the time of the neurology referral.

## 2016-03-29 ENCOUNTER — Encounter: Payer: Medicare Other | Admitting: Family Medicine

## 2016-04-12 DIAGNOSIS — R202 Paresthesia of skin: Secondary | ICD-10-CM | POA: Diagnosis not present

## 2016-05-19 ENCOUNTER — Encounter: Payer: Self-pay | Admitting: Family Medicine

## 2016-05-19 ENCOUNTER — Ambulatory Visit (INDEPENDENT_AMBULATORY_CARE_PROVIDER_SITE_OTHER): Payer: Medicare HMO | Admitting: Family Medicine

## 2016-05-19 VITALS — BP 138/74 | HR 84 | Temp 98.4°F | Resp 16 | Ht 72.0 in | Wt 210.0 lb

## 2016-05-19 DIAGNOSIS — Z23 Encounter for immunization: Secondary | ICD-10-CM

## 2016-05-19 DIAGNOSIS — I10 Essential (primary) hypertension: Secondary | ICD-10-CM | POA: Diagnosis not present

## 2016-05-19 DIAGNOSIS — Z0000 Encounter for general adult medical examination without abnormal findings: Secondary | ICD-10-CM | POA: Diagnosis not present

## 2016-05-19 DIAGNOSIS — R480 Dyslexia and alexia: Secondary | ICD-10-CM | POA: Diagnosis not present

## 2016-05-19 DIAGNOSIS — E782 Mixed hyperlipidemia: Secondary | ICD-10-CM | POA: Diagnosis not present

## 2016-05-19 NOTE — Patient Instructions (Signed)
We will call you with the lab results. 

## 2016-05-19 NOTE — Progress Notes (Addendum)
Subjective:     Patient ID: Anthony Hood, male   DOB: 28-Nov-1960, 57 y.o.   MRN: [id number]  HPI  Chief Complaint  Patient presents with  . Annual Exam  States he has disability due to dyslexia/reading difficulty.   Review of Systems General: Feeling well, states he plays basketball at the [facility] for exercise HEENT: regular dental visits and eye exams (corrective lenses) both exams pending soon. Cardiovascular: no chest pain, shortness of breath, or palpitations GI: occasional heartburn, no change in bowel habits or blood in the stool, colonoscopy/polypectomy in 2017. GU: nocturia x0-2 , no change in bladder habits  Neuro: no change in memory; defers cognitive screen Psychiatric: not depressed Musculoskeletal: no joint pain    Objective:   Physical Exam  Constitutional: He appears well-developed and well-nourished. No distress.  Eyes: PERRLA, EOMI Neck: no thyromegaly, tenderness or nodules, no cervical adenopathy or bruits ENT: TM's intact without inflammation; No tonsillar enlargement or exudate, Lungs: Clear Heart : RRR without murmur or gallop Abd: bowel sounds present, soft, non-tender, no organomegaly Rectal: Prostate not well palpated Extremities: no edema Skin: well healed periumbilical scar     Assessment:    1. Medicare annual wellness visit, subsequent - PSA  2. Mixed hyperlipidemia - Lipid panel  3. Essential (primary) hypertension - Comprehensive metabolic panel  4. Need for influenza vaccination - Flu Vaccine QUAD 36+ mos PF IM (Fluarix & Fluzone Quad PF)  5. Need for diphtheria-tetanus-pertussis (Tdap) vaccine - Tdap vaccine greater than or equal to 7yo IM  6. Dyslexia     Plan:    Further f/u pending lab work.

## 2016-05-20 DIAGNOSIS — Z0000 Encounter for general adult medical examination without abnormal findings: Secondary | ICD-10-CM | POA: Diagnosis not present

## 2016-05-20 DIAGNOSIS — E782 Mixed hyperlipidemia: Secondary | ICD-10-CM | POA: Diagnosis not present

## 2016-05-20 DIAGNOSIS — I10 Essential (primary) hypertension: Secondary | ICD-10-CM | POA: Diagnosis not present

## 2016-05-21 LAB — COMPREHENSIVE METABOLIC PANEL
ALT: 37 IU/L (ref 0–44)
AST: 31 IU/L (ref 0–40)
Albumin/Globulin Ratio: 1.4 (ref 1.2–2.2)
Albumin: 4.3 g/dL (ref 3.5–5.5)
Alkaline Phosphatase: 64 IU/L (ref 39–117)
BUN/Creatinine Ratio: 12 (ref 9–20)
BUN: 15 mg/dL (ref 6–24)
Bilirubin Total: 0.3 mg/dL (ref 0.0–1.2)
CO2: 21 mmol/L (ref 18–29)
Calcium: 9.2 mg/dL (ref 8.7–10.2)
Chloride: 99 mmol/L (ref 96–106)
Creatinine, Ser: 1.22 mg/dL (ref 0.76–1.27)
GFR, EST AFRICAN AMERICAN: 76 mL/min/{1.73_m2} (ref >59)
GFR, EST NON AFRICAN AMERICAN: 66 mL/min/{1.73_m2} (ref >59)
GLUCOSE: 96 mg/dL (ref 65–99)
Globulin, Total: 3 g/dL (ref 1.5–4.5)
Potassium: 4.5 mmol/L (ref 3.5–5.2)
Sodium: 138 mmol/L (ref 134–144)
TOTAL PROTEIN: 7.3 g/dL (ref 6.0–8.5)

## 2016-05-21 LAB — LIPID PANEL
Chol/HDL Ratio: 3.3 ratio units (ref 0.0–5.0)
Cholesterol, Total: 220 mg/dL — ABNORMAL HIGH (ref 100–199)
HDL: 67 mg/dL (ref >39)
LDL Calculated: 128 mg/dL — ABNORMAL HIGH (ref 0–99)
TRIGLYCERIDES: 123 mg/dL (ref 0–149)
VLDL CHOLESTEROL CAL: 25 mg/dL (ref 5–40)

## 2016-05-21 LAB — PSA: PROSTATE SPECIFIC AG, SERUM: 0.5 ng/mL (ref 0.0–4.0)

## 2016-05-23 ENCOUNTER — Telehealth: Payer: Self-pay

## 2016-05-23 NOTE — Telephone Encounter (Signed)
-----   Message from Carmon Ginsberg, [place] sent at 05/21/2016  9:13 AM EST ----- Your labs look good except for mildly elevated cholesterol. If you are taking the cholesterol medication daily we will need to increase the dose. Also would recommend 81 mg. Aspirin daily to protect against heart attack. Do you wish to proceed?

## 2016-05-23 NOTE — Telephone Encounter (Signed)
Patient has been advised he states that he had poor compliance with atorvastatin. Patient was encouraged to take medication daily to reduce elevated cholesterol and to lower risk of cardiac events in future, patient was advised to take Aspirin 81mg  daily, patient states that he will start taking in the morning. KW

## 2016-06-02 DIAGNOSIS — H18413 Arcus senilis, bilateral: Secondary | ICD-10-CM | POA: Diagnosis not present

## 2016-06-02 DIAGNOSIS — H57059 Tonic pupil, unspecified eye: Secondary | ICD-10-CM | POA: Diagnosis not present

## 2016-06-02 DIAGNOSIS — I10 Essential (primary) hypertension: Secondary | ICD-10-CM | POA: Diagnosis not present

## 2016-06-02 DIAGNOSIS — Z0100 Encounter for examination of eyes and vision without abnormal findings: Secondary | ICD-10-CM | POA: Diagnosis not present

## 2016-06-02 DIAGNOSIS — H524 Presbyopia: Secondary | ICD-10-CM | POA: Diagnosis not present

## 2016-07-11 ENCOUNTER — Other Ambulatory Visit: Payer: Self-pay | Admitting: Family Medicine

## 2016-07-11 NOTE — Telephone Encounter (Signed)
Left detailed voicemail, both prescriptions were filled on 05/17/16 with 3 additional refills. KW

## 2016-07-11 NOTE — Telephone Encounter (Signed)
Pt contacted office for refill request on the following medications:  [place].  CB#[phone]/MW  enalapril (VASOTEC) 5 MG tablet  atorvastatin (LIPITOR) 20 MG tablet  Pt wife states pt is seeing Mikki Santee now/MW

## 2016-08-10 ENCOUNTER — Telehealth: Payer: Self-pay | Admitting: Family Medicine

## 2016-08-10 NOTE — Telephone Encounter (Signed)
Pt needs refills  atorvastatin (LIPITOR) 20 MG tablet   enalapril (VASOTEC) 5 MG tablet  [facility] [address]  [place]

## 2016-08-10 NOTE — Telephone Encounter (Signed)
Both filled last 05/18/15, please review and advise. KW

## 2016-08-11 ENCOUNTER — Other Ambulatory Visit: Payer: Self-pay | Admitting: Family Medicine

## 2016-08-11 DIAGNOSIS — E7800 Pure hypercholesterolemia, unspecified: Secondary | ICD-10-CM

## 2016-08-11 DIAGNOSIS — I10 Essential (primary) hypertension: Secondary | ICD-10-CM

## 2016-08-11 MED ORDER — ENALAPRIL MALEATE 5 MG PO TABS: 5.0000 mg | ORAL_TABLET | Freq: Every day | ORAL | 3 refills | Status: DC

## 2016-08-11 MED ORDER — ATORVASTATIN CALCIUM 20 MG PO TABS: 20.0000 mg | ORAL_TABLET | Freq: Every day | ORAL | 3 refills | Status: DC

## 2016-08-11 NOTE — Telephone Encounter (Signed)
Refills sent to his pharmacy. Had his annual physical in March 2018.

## 2016-09-27 DIAGNOSIS — Z0101 Encounter for examination of eyes and vision with abnormal findings: Secondary | ICD-10-CM | POA: Diagnosis not present

## 2016-12-12 IMAGING — CR DG CHEST 2V
1 series · 2 of 2 positions shown · non-contrast
Comparison: Report of a portable chest x-ray dated July 27, 2002

CLINICAL DATA: Exertional shortness of breath for the past 3 days,
no known cardiopulmonary disease.

EXAM:
CHEST  2 VIEW

[Series 1: kdxr chest pa (or ap) and lat · 0.14mm/px · 2 of 2 slices shown]
[im 1/2]
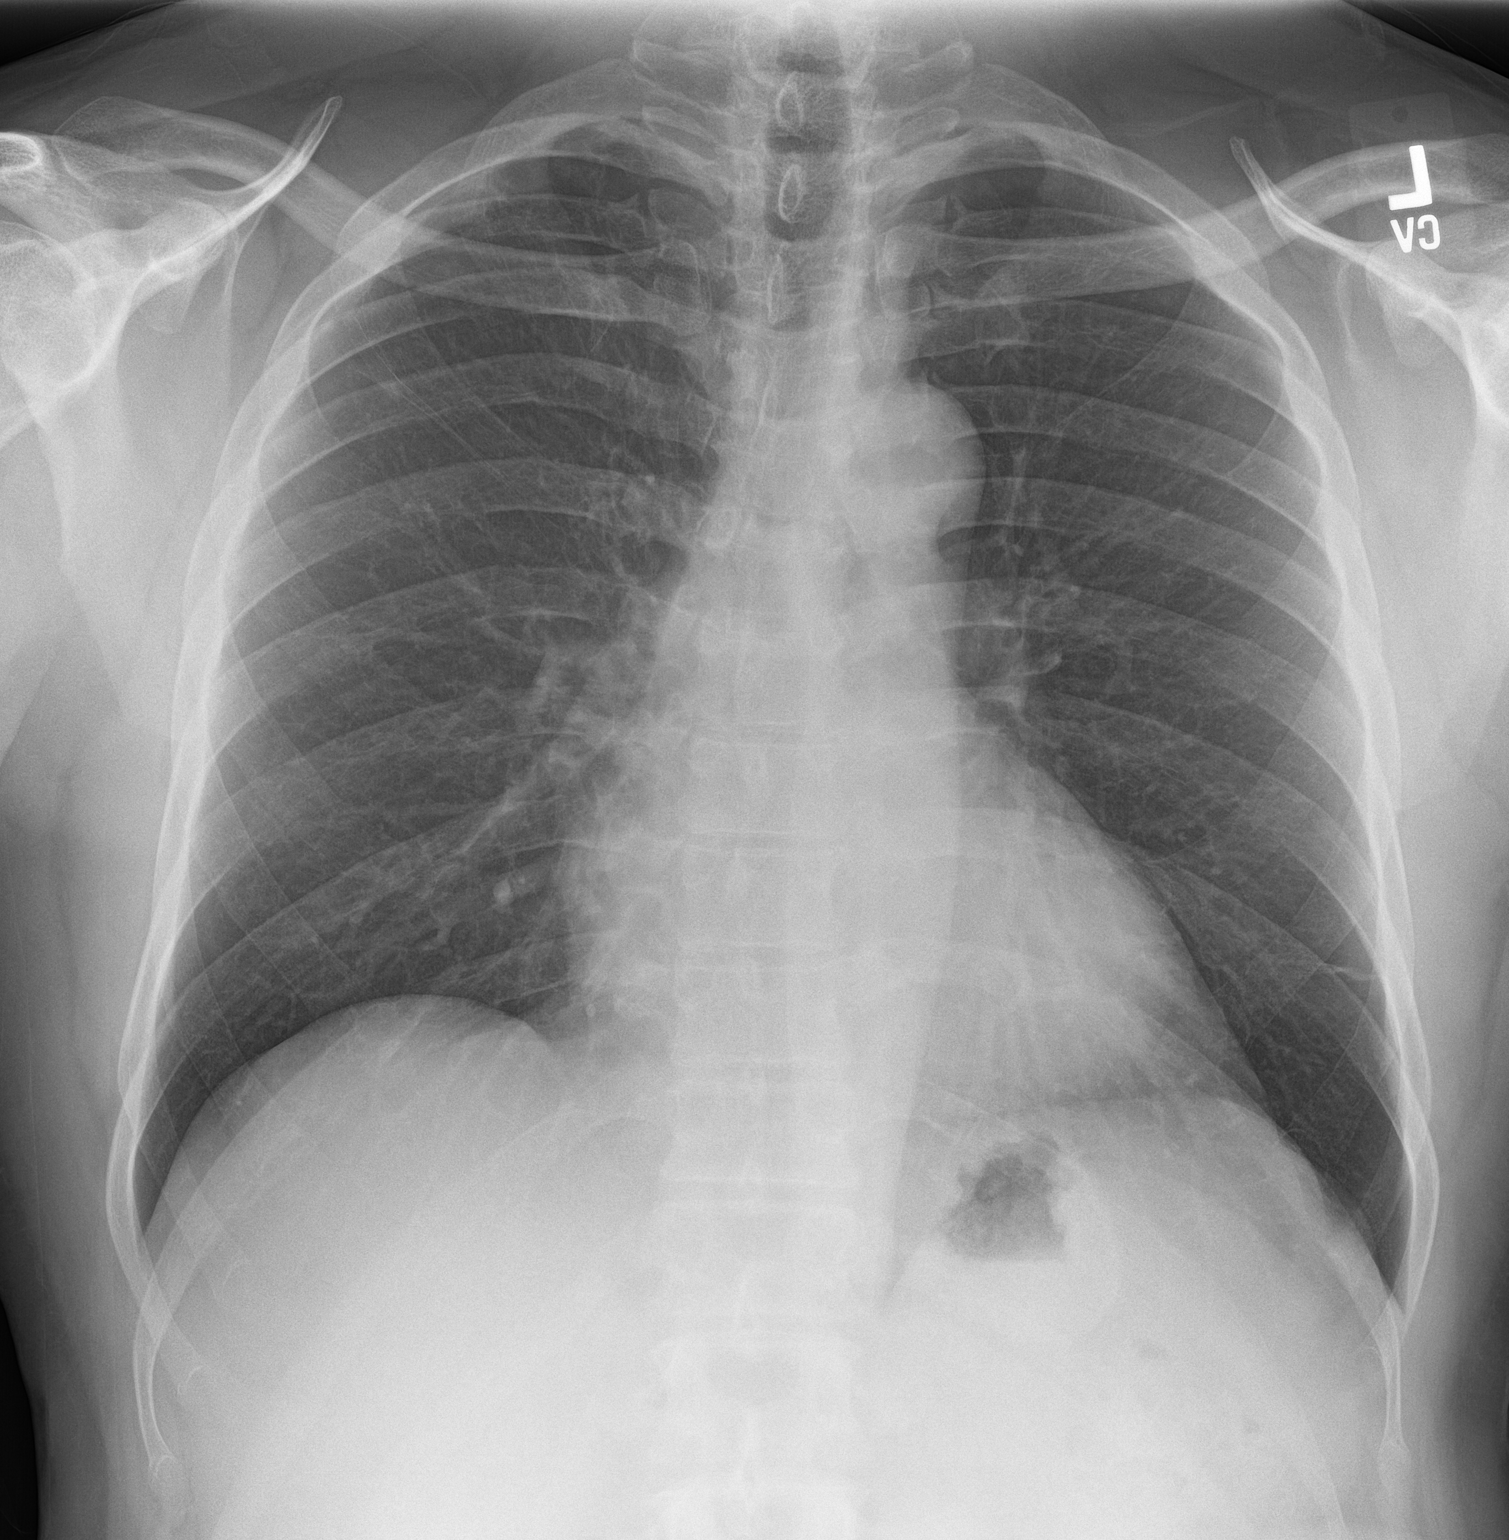
[im 2/2]
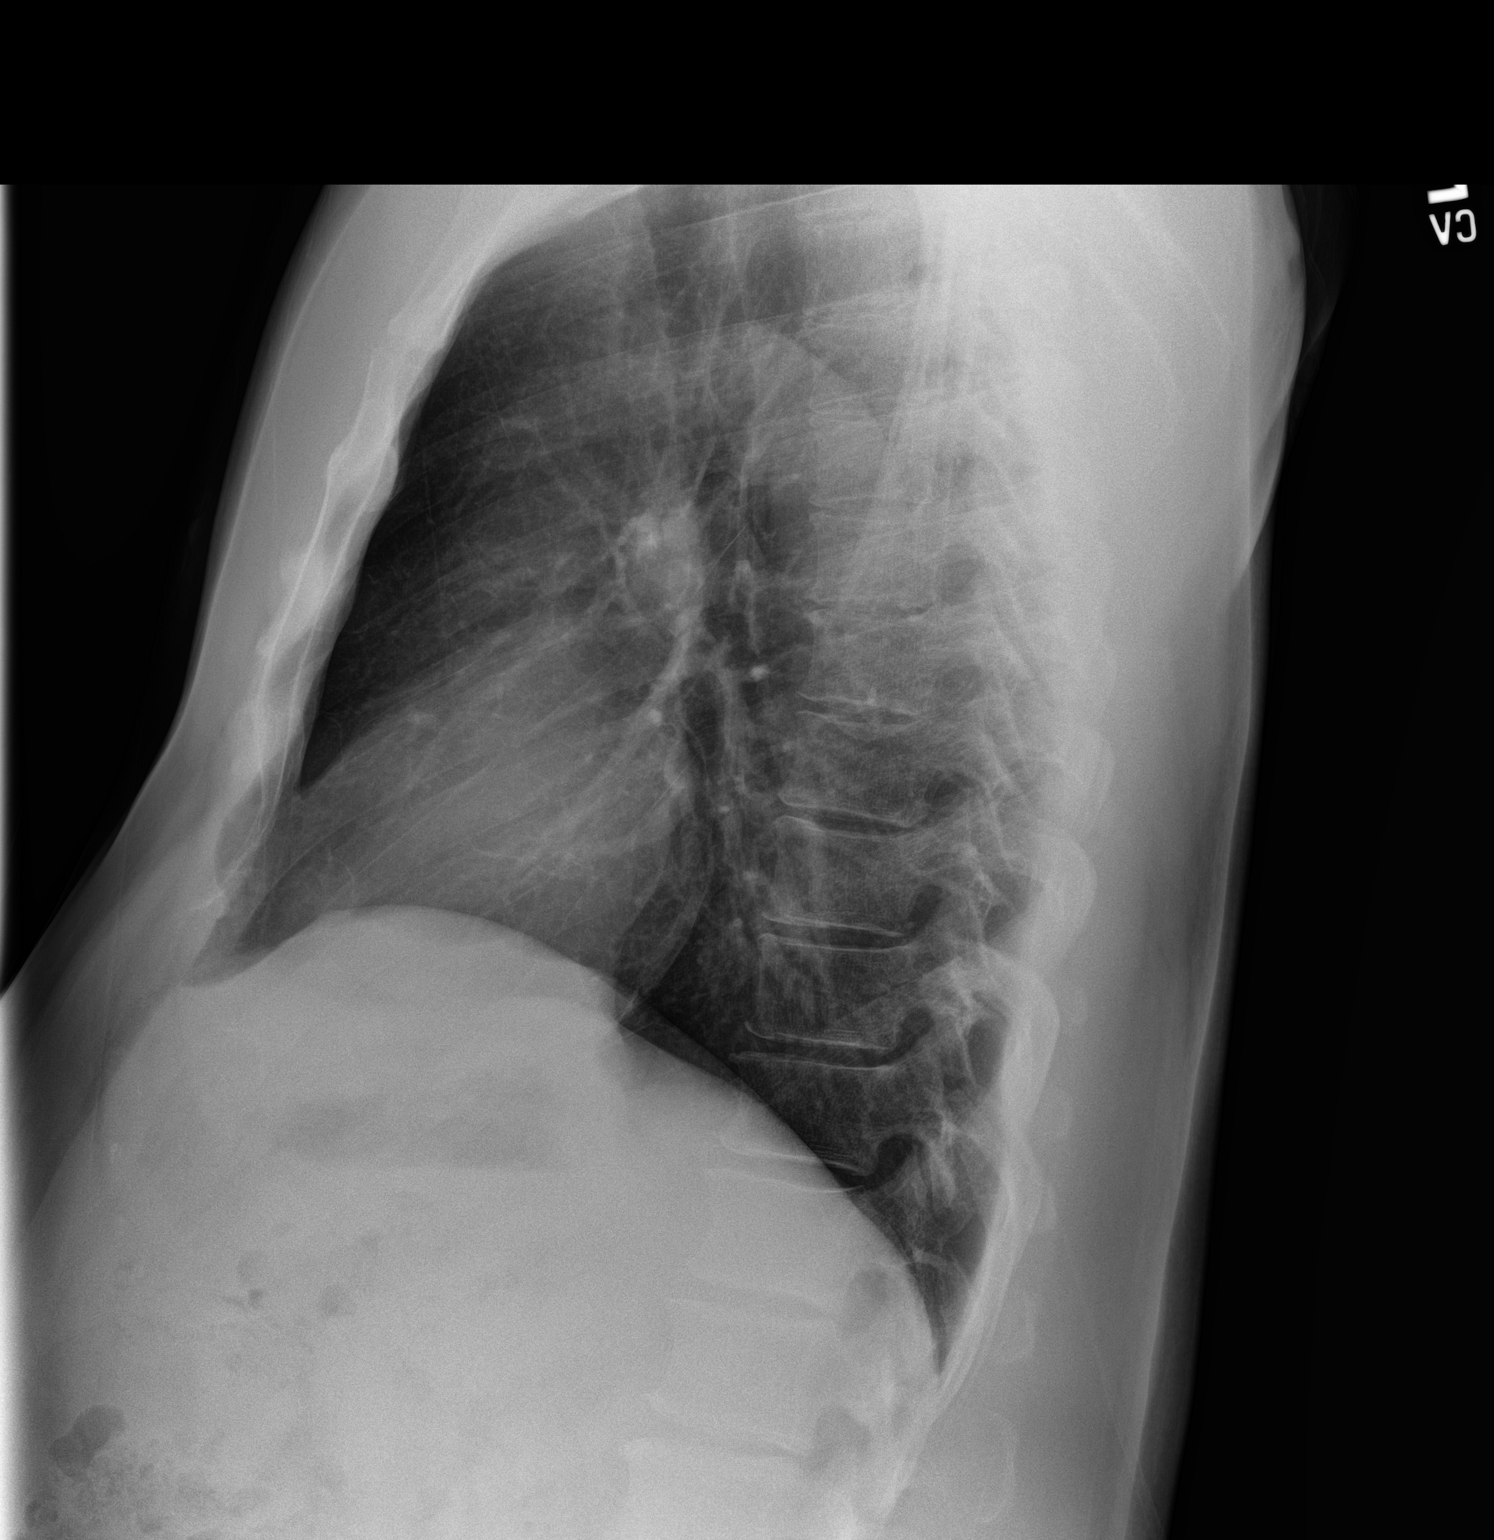

[2 of 2 positions shown; findings below may reference images not displayed]

FINDINGS: The lungs are well-expanded. There is no focal infiltrate. There is
minimal scarring in the left lower lobe laterally. There is no
pleural effusion. The heart and pulmonary vascularity are normal.
The mediastinum is normal in width. The bony thorax exhibits no
acute abnormality.
IMPRESSION: There is no evidence of CHF nor other active cardiopulmonary
disease.

If anticipated cardiac workup is negative, chest CT scanning may be
useful to assess the pulmonary parenchyma more completely.

## 2017-05-22 ENCOUNTER — Telehealth: Payer: Self-pay

## 2017-05-22 NOTE — Telephone Encounter (Signed)
LMTCB and schedule AWV prior to CPE on 06/12/17 with Simona Huh.  -MM

## 2017-05-22 NOTE — Telephone Encounter (Signed)
Patent's wife called back to schedule and we are not sure if this can be done on the same day with [employer].  Please let me know and I will schedule or call patient to schedule.

## 2017-05-30 NOTE — Telephone Encounter (Signed)
LM advising pt that the apts can be on the same day however, there is not an available apt open prior to the CPE with PCP. Advised pt this can be done on a separate day prior to the cpe. Requested a CB to have this scheduled.

## 2017-05-30 NOTE — Telephone Encounter (Signed)
Holland Falling can be scheduled on the same day. Please send back if you need me to schedule this.  Thanks! MM

## 2017-06-01 NOTE — Telephone Encounter (Signed)
Per CPE note- pt needs same day apts for his cpe and awv. No available time to see pt prior to cpe. -MM

## 2017-06-12 ENCOUNTER — Ambulatory Visit (INDEPENDENT_AMBULATORY_CARE_PROVIDER_SITE_OTHER): Payer: Medicare HMO | Admitting: Family Medicine

## 2017-06-12 ENCOUNTER — Encounter: Payer: Self-pay | Admitting: Family Medicine

## 2017-06-12 VITALS — BP 122/76 | HR 72 | Temp 97.7°F | Ht 72.0 in | Wt 206.4 lb

## 2017-06-12 DIAGNOSIS — Z114 Encounter for screening for human immunodeficiency virus [HIV]: Secondary | ICD-10-CM

## 2017-06-12 DIAGNOSIS — I10 Essential (primary) hypertension: Secondary | ICD-10-CM

## 2017-06-12 DIAGNOSIS — Z0000 Encounter for general adult medical examination without abnormal findings: Secondary | ICD-10-CM | POA: Diagnosis not present

## 2017-06-12 DIAGNOSIS — E782 Mixed hyperlipidemia: Secondary | ICD-10-CM

## 2017-06-12 DIAGNOSIS — Z1159 Encounter for screening for other viral diseases: Secondary | ICD-10-CM | POA: Diagnosis not present

## 2017-06-12 DIAGNOSIS — R69 Illness, unspecified: Secondary | ICD-10-CM | POA: Diagnosis not present

## 2017-06-12 NOTE — Progress Notes (Signed)
Patient: Anthony Hood, Male    DOB: 1961-02-17, 57 y.o.   MRN: [id number] Visit Date: 06/12/2017  Today's Provider: Vernie Murders, PA   Chief Complaint  Patient presents with  . Medicare Wellness   Subjective:    Annual wellness visit Anthony Hood is a 57 y.o. male who presents today for his Subsequent Annual Wellness Visit. He feels well. He reports exercising 2 days per week. He reports he is sleeping well.  -----------------------------------------------------------   Review of Systems  Constitutional: Positive for activity change.  HENT: Negative.   Eyes: Negative.   Respiratory: Negative.   Cardiovascular: Negative.   Gastrointestinal: Negative.   Endocrine: Negative.   Genitourinary: Negative.   Musculoskeletal: Positive for back pain.  Skin: Negative.   Allergic/Immunologic: Negative.   Neurological: Negative.   Hematological: Negative.     Social History   Socioeconomic History  . Marital status: Married    Spouse name: Not on file  . Number of children: Not on file  . Years of education: Not on file  . Highest education level: Not on file  Occupational History  . Not on file  Social Needs  . Financial resource strain: Not on file  . Food insecurity:    Worry: Not on file    Inability: Not on file  . Transportation needs:    Medical: Not on file    Non-medical: Not on file  Tobacco Use  . Smoking status: Never Smoker  . Smokeless tobacco: Never Used  Substance and Sexual Activity  . Alcohol use: Yes    Alcohol/week: 9.0 oz    Types: 15 Cans of beer per week  . Drug use: No  . Sexual activity: Not on file  Lifestyle  . Physical activity:    Days per week: Not on file    Minutes per session: Not on file  . Stress: Not on file  Relationships  . Social connections:    Talks on phone: Not on file    Gets together: Not on file    Attends religious service: Not on file    Active member of club or organization: Not on file    Attends meetings  of clubs or organizations: Not on file    Relationship status: Not on file  . Intimate partner violence:    Fear of current or ex partner: Not on file    Emotionally abused: Not on file    Physically abused: Not on file    Forced sexual activity: Not on file  Other Topics Concern  . Not on file  Social History Narrative  . Not on file    Patient Active Problem List   Diagnosis Date Noted  . Dyslexia 05/19/2016  . Essential (primary) hypertension 04/24/2015  . Fam hx-ischem heart disease 10/21/2008  . HLD (hyperlipidemia) 01/11/2007    Past Surgical History:  Procedure Laterality Date  . COLONOSCOPY WITH PROPOFOL N/A 07/20/2015   Procedure: COLONOSCOPY WITH PROPOFOL;  Surgeon: Manya Silvas, MD;  Location: [facility] ENDOSCOPY;  Service: Endoscopy;  Laterality: N/A;  . HERNIA REPAIR     umbilical  . SHOULDER SURGERY      His family history includes Heart disease in his father; Hypertension in his sister; Seizures in his sister.     Previous Medications   ATORVASTATIN (LIPITOR) 20 MG TABLET    Take 1 tablet (20 mg total) by mouth daily at 6 PM.   ENALAPRIL (VASOTEC) 5 MG TABLET    Take 1 tablet (5 mg  total) by mouth daily.    Patient Care Team: Rohin Krejci, Vickki Muff, PA as PCP - General (Family Medicine)      Objective:   Vitals: BP 122/76 (BP Location: Right Arm, Patient Position: Sitting, Cuff Size: Normal)   Pulse 72   Temp 97.7 F (36.5 C) (Oral)   Ht 6' (1.829 m)   Wt 206 lb 6.4 oz (93.6 kg)   SpO2 97%   BMI 27.99 kg/m   Physical Exam  Constitutional: He is oriented to person, place, and time. He appears well-developed and well-nourished.  HENT:  Head: Normocephalic and atraumatic.  Right Ear: External ear normal.  Left Ear: External ear normal.  Nose: Nose normal.  Mouth/Throat: Oropharynx is clear and moist.  Eyes: Pupils are equal, round, and reactive to light. Conjunctivae and EOM are normal. Right eye exhibits no discharge.  Arcus both eyes.  Neck:  Normal range of motion. Neck supple. No tracheal deviation present. No thyromegaly present.  Cardiovascular: Normal rate, regular rhythm, normal heart sounds and intact distal pulses.  No murmur heard. Pulmonary/Chest: Effort normal and breath sounds normal. No respiratory distress. He has no wheezes. He has no rales. He exhibits no tenderness.  Abdominal: Soft. He exhibits no distension and no mass. There is no tenderness. There is no rebound and no guarding.  Genitourinary: Rectum normal, prostate normal and penis normal. Rectal exam shows guaiac negative stool.  Musculoskeletal: Normal range of motion. He exhibits no edema or tenderness.  Minimal crepitus in the left knee at near full extension, only. No pain or effusion.  Lymphadenopathy:    He has no cervical adenopathy.  Neurological: He is alert and oriented to person, place, and time. He has normal reflexes. No cranial nerve deficit. He exhibits normal muscle tone. Coordination normal.  Skin: Skin is warm and dry. No rash noted. No erythema.  Psychiatric: He has a normal mood and affect. His behavior is normal. Judgment and thought content normal.   Activities of Daily Living In your present state of health, do you have any difficulty performing the following activities: 06/12/2017  Hearing? N  Vision? N  Difficulty concentrating or making decisions? N  Walking or climbing stairs? N  Dressing or bathing? N  Doing errands, shopping? N  Some recent data might be hidden    Fall Risk Assessment Fall Risk  06/12/2017 05/19/2016 05/18/2015  Falls in the past year? No No No     Depression Screen PHQ 2/9 Scores 06/12/2017 05/19/2016 05/18/2015  PHQ - 2 Score 2 0 0  PHQ- 9 Score 2 0 -     Cognitive Testing - 6-CIT  Correct? Score   What year is it? yes 0 0 or 4  What month is it? yes 0 0 or 3  Memorize:    Pia Mau,  41,  [address]  [place], Difficult to accomplish due to Dyslexia     What time is it? (within 1 hour) yes 0 0 or 3   Count backwards from 20 yes 0 0, 2, or 4  Name the months of the year Dyslexia history 4 0, 2, or 4  Repeat name & address above yes 3 0, 2, 4, 6, 8, or 10       TOTAL SCORE  7/28   Interpretation:  Normal  Normal (0-7) Abnormal (8-28)     Assessment & Plan:     Annual Wellness Visit  Reviewed patient's Family Medical History Reviewed and updated list of patient's medical providers  Assessment of cognitive impairment was done Assessed patient's functional ability Established a written schedule for health screening [place] Completed and Reviewed  Exercise Activities and Dietary recommendations Goals    [place]4 days a week with wife. Occasionally ride bikes.      Immunization History  Administered Date(s) Administered  . Influenza Split 01/18/2010  . Influenza,inj,Quad PF,6+ Mos 01/03/2013, 12/16/2013, 05/19/2016  . Tdap 05/19/2016    Health Maintenance  Topic Date Due  . Hepatitis C Screening  March 08, 1961  . HIV Screening  12/22/1974  . INFLUENZA VACCINE  02/12/2018 (Originally 10/19/2016)  . COLONOSCOPY  07/19/2025  . TETANUS/TDAP  05/20/2026     Discussed health benefits of physical activity, and encouraged him to engage in regular exercise appropriate for his age and condition.    ------------------------------------------------------------------------------------------------------------ 1. Medicare annual wellness visit, subsequent General health good. Learning disability due to dyslexia since he was young. Has difficulty completing the CIT-6 screening. Last colonoscopy was in 2017 with 7 benign polyps removed by Dr. Vira Agar (GI). Immunizations up to date. Given anticipatory counseling. PSA normal 05-20-16 and normal DRE today.  2. Encounter for screening for HIV - HIV antibody d 3. Need for hepatitis C screening test - Hepatitis C Antibody  4. Essential (primary) hypertension Well controlled. Continues  Enalapril 5 mg qd without side effects. Recheck labs. - TSH - CBC with Differential - Comprehensive metabolic panel  5. Mixed hyperlipidemia Tolerating Atorvastatin 20 mg qd with low fat diet. No myalgias. Recheck labs and follow up pending reports. - Lipid Profile - TSH - Comprehensive metabolic panel

## 2017-06-13 ENCOUNTER — Other Ambulatory Visit: Payer: Self-pay

## 2017-06-13 LAB — LIPID PANEL
CHOL/HDL RATIO: 3.4 ratio (ref 0.0–5.0)
Cholesterol, Total: 254 mg/dL — ABNORMAL HIGH (ref 100–199)
HDL: 75 mg/dL (ref >39)
LDL Calculated: 152 mg/dL — ABNORMAL HIGH (ref 0–99)
Triglycerides: 134 mg/dL (ref 0–149)
VLDL Cholesterol Cal: 27 mg/dL (ref 5–40)

## 2017-06-13 LAB — COMPREHENSIVE METABOLIC PANEL
ALK PHOS: 57 IU/L (ref 39–117)
ALT: 40 IU/L (ref 0–44)
AST: 34 IU/L (ref 0–40)
Albumin/Globulin Ratio: 1.4 (ref 1.2–2.2)
Albumin: 4.4 g/dL (ref 3.5–5.5)
BILIRUBIN TOTAL: 0.3 mg/dL (ref 0.0–1.2)
BUN/Creatinine Ratio: 13 (ref 9–20)
BUN: 15 mg/dL (ref 6–24)
CHLORIDE: 101 mmol/L (ref 96–106)
CO2: 21 mmol/L (ref 20–29)
Calcium: 9.6 mg/dL (ref 8.7–10.2)
Creatinine, Ser: 1.18 mg/dL (ref 0.76–1.27)
GFR calc Af Amer: 79 mL/min/{1.73_m2} (ref >59)
GFR calc non Af Amer: 68 mL/min/{1.73_m2} (ref >59)
GLUCOSE: 88 mg/dL (ref 65–99)
Globulin, Total: 3.2 g/dL (ref 1.5–4.5)
Potassium: 4.7 mmol/L (ref 3.5–5.2)
Sodium: 137 mmol/L (ref 134–144)
Total Protein: 7.6 g/dL (ref 6.0–8.5)

## 2017-06-13 LAB — TSH: TSH: 1.15 u[IU]/mL (ref 0.450–4.500)

## 2017-06-13 LAB — CBC WITH DIFFERENTIAL/PLATELET
BASOS ABS: 0 10*3/uL (ref 0.0–0.2)
Basos: 1 %
EOS (ABSOLUTE): 0.1 10*3/uL (ref 0.0–0.4)
Eos: 1 %
Hematocrit: 43.1 % (ref 37.5–51.0)
Hemoglobin: 14.1 g/dL (ref 13.0–17.7)
Immature Grans (Abs): 0 10*3/uL (ref 0.0–0.1)
Immature Granulocytes: 0 %
LYMPHS ABS: 1.8 10*3/uL (ref 0.7–3.1)
Lymphs: 30 %
MCH: 28.8 pg (ref 26.6–33.0)
MCHC: 32.7 g/dL (ref 31.5–35.7)
MCV: 88 fL (ref 79–97)
Monocytes Absolute: 0.4 10*3/uL (ref 0.1–0.9)
Monocytes: 8 %
NEUTROS ABS: 3.5 10*3/uL (ref 1.4–7.0)
Neutrophils: 60 %
PLATELETS: 256 10*3/uL (ref 150–379)
RBC: 4.89 x10E6/uL (ref 4.14–5.80)
RDW: 14.4 % (ref 12.3–15.4)
WBC: 5.8 10*3/uL (ref 3.4–10.8)

## 2017-06-13 LAB — HIV ANTIBODY (ROUTINE TESTING W REFLEX): HIV Screen 4th Generation wRfx: NONREACTIVE

## 2017-06-13 LAB — HEPATITIS C ANTIBODY

## 2017-06-13 MED ORDER — ATORVASTATIN CALCIUM 40 MG PO TABS: 40.0000 mg | ORAL_TABLET | Freq: Every day | ORAL | 3 refills | Status: DC

## 2017-06-13 NOTE — Telephone Encounter (Signed)
-----   Message from Margo Common, [place] sent at 06/13/2017  8:15 AM EDT ----- All blood tests normal except LDL cholesterol is high. Need to increase Atorvastatin to 40 mg qd #90 & 3 RF (which pharmacy?) and low fat diet with 30 minutes of exercise 3-4 days a week. Recheck level in 3 months.

## 2017-06-13 NOTE — Telephone Encounter (Signed)
Patient advised. RX sent to [facility]. 3 month follow up scheduled.

## 2017-06-13 NOTE — Telephone Encounter (Signed)
LMTCB

## 2017-09-14 ENCOUNTER — Ambulatory Visit: Payer: Medicare HMO | Admitting: Family Medicine

## 2017-09-14 NOTE — Progress Notes (Deleted)
       Patient: Anthony Hood Male    DOB: December 10, 1960   57 y.o.   MRN: [id number] Visit Date: 09/14/2017  Today's Provider: Vernie Murders, PA   No chief complaint on file.  Subjective:    HPI     ------------------------------------------------------------------------  No Known Allergies   Current Outpatient Medications:  .  atorvastatin (LIPITOR) 40 MG tablet, Take 1 tablet (40 mg total) by mouth daily., Disp: 90 tablet, Rfl: 3 .  enalapril (VASOTEC) 5 MG tablet, Take 1 tablet (5 mg total) by mouth daily., Disp: 90 tablet, Rfl: 3  Review of Systems  Constitutional: Negative.   Respiratory: Negative.   Cardiovascular: Negative.   Musculoskeletal: Negative.     Social History   Tobacco Use  . Smoking status: Never Smoker  . Smokeless tobacco: Never Used  Substance Use Topics  . Alcohol use: Yes    Alcohol/week: 9.0 oz    Types: 15 Cans of beer per week   Objective:   There were no vitals taken for this visit.   Physical Exam      Assessment & Plan:           Vernie Murders, PA  [facility]

## 2017-10-31 DIAGNOSIS — M2022 Hallux rigidus, left foot: Secondary | ICD-10-CM | POA: Diagnosis not present

## 2017-10-31 DIAGNOSIS — M79672 Pain in left foot: Secondary | ICD-10-CM | POA: Diagnosis not present

## 2017-11-28 ENCOUNTER — Other Ambulatory Visit: Payer: Self-pay | Admitting: Family Medicine

## 2017-11-28 DIAGNOSIS — I10 Essential (primary) hypertension: Secondary | ICD-10-CM

## 2017-11-28 MED ORDER — ENALAPRIL MALEATE 5 MG PO TABS: 5.0000 mg | ORAL_TABLET | Freq: Every day | ORAL | 0 refills | Status: DC

## 2017-11-28 NOTE — Telephone Encounter (Signed)
[facility] faxed a refill request on the following medication. Thanks CC  enalapril (VASOTEC) 5 MG tablet

## 2017-12-07 DIAGNOSIS — Z0100 Encounter for examination of eyes and vision without abnormal findings: Secondary | ICD-10-CM | POA: Diagnosis not present

## 2017-12-16 ENCOUNTER — Emergency Department
Admission: EM | Admit: 2017-12-16 | Discharge: 2017-12-16 | Disposition: A | Discharge status: Home / Self Care | Payer: Medicare HMO | Attending: Emergency Medicine | Admitting: Emergency Medicine

## 2017-12-16 ENCOUNTER — Encounter: Payer: Self-pay | Admitting: Emergency Medicine

## 2017-12-16 DIAGNOSIS — M545 Low back pain: Secondary | ICD-10-CM | POA: Diagnosis not present

## 2017-12-16 DIAGNOSIS — I10 Essential (primary) hypertension: Secondary | ICD-10-CM | POA: Diagnosis not present

## 2017-12-16 DIAGNOSIS — Z79899 Other long term (current) drug therapy: Secondary | ICD-10-CM | POA: Insufficient documentation

## 2017-12-16 MED ORDER — OXYCODONE-ACETAMINOPHEN 5-325 MG PO TABS
1.0000 | ORAL_TABLET | Freq: Once | ORAL | Status: AC
  Administered 2017-12-16: 1 via ORAL
  Filled 2017-12-16: qty 1

## 2017-12-16 MED ORDER — CYCLOBENZAPRINE HCL 10 MG PO TABS
10.0000 mg | ORAL_TABLET | Freq: Once | ORAL | Status: AC
  Administered 2017-12-16: 10 mg via ORAL
  Filled 2017-12-16: qty 1

## 2017-12-16 MED ORDER — IBUPROFEN 600 MG PO TABS: 600.0000 mg | ORAL_TABLET | Freq: Three times a day (TID) | ORAL | 0 refills | Status: DC | PRN

## 2017-12-16 MED ORDER — OXYCODONE-ACETAMINOPHEN 7.5-325 MG PO TABS: 1.0000 | ORAL_TABLET | Freq: Four times a day (QID) | ORAL | 0 refills | Status: DC | PRN

## 2017-12-16 MED ORDER — IBUPROFEN 600 MG PO TABS
600.0000 mg | ORAL_TABLET | Freq: Once | ORAL | Status: AC
  Administered 2017-12-16: 600 mg via ORAL
  Filled 2017-12-16: qty 1

## 2017-12-16 MED ORDER — CYCLOBENZAPRINE HCL 10 MG PO TABS: 10.0000 mg | ORAL_TABLET | Freq: Three times a day (TID) | ORAL | 0 refills | Status: DC | PRN
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
## 2018-01-05 ENCOUNTER — Telehealth: Payer: Self-pay | Admitting: Family Medicine

## 2018-01-05 NOTE — Telephone Encounter (Signed)
Need appointment to document condition of teeth to get referral arranged.

## 2018-01-05 NOTE — Telephone Encounter (Signed)
Pt's wife Lynelle Smoke is requesting a referral for pt to go to [facility]. Tammy stated they just need to go to the dentist and that office requires a referral. Please advise. Thanks TNP

## 2018-01-05 NOTE — Telephone Encounter (Signed)
Please review

## 2018-01-08 NOTE — Telephone Encounter (Signed)
Spoke to pt and made appt for 01/09/18 with dennis.  dbs,cma

## 2018-01-09 ENCOUNTER — Ambulatory Visit (INDEPENDENT_AMBULATORY_CARE_PROVIDER_SITE_OTHER): Payer: Medicare HMO | Admitting: Family Medicine

## 2018-01-09 ENCOUNTER — Encounter: Payer: Self-pay | Admitting: Family Medicine

## 2018-01-09 VITALS — BP 110/80 | HR 68 | Temp 98.2°F | Resp 16 | Wt 201.4 lb

## 2018-01-09 DIAGNOSIS — R480 Dyslexia and alexia: Secondary | ICD-10-CM | POA: Diagnosis not present

## 2018-01-09 DIAGNOSIS — K1379 Other lesions of oral mucosa: Secondary | ICD-10-CM | POA: Diagnosis not present

## 2018-01-09 DIAGNOSIS — I10 Essential (primary) hypertension: Secondary | ICD-10-CM | POA: Diagnosis not present

## 2018-01-09 DIAGNOSIS — Z23 Encounter for immunization: Secondary | ICD-10-CM

## 2018-01-09 DIAGNOSIS — E782 Mixed hyperlipidemia: Secondary | ICD-10-CM
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
## 2018-01-10 LAB — COMPREHENSIVE METABOLIC PANEL
A/G RATIO: 1.7 (ref 1.2–2.2)
ALBUMIN: 4.6 g/dL (ref 3.5–5.5)
ALK PHOS: 58 IU/L (ref 39–117)
ALT: 43 IU/L (ref 0–44)
AST: 37 IU/L (ref 0–40)
BUN / CREAT RATIO: 10 (ref 9–20)
BUN: 12 mg/dL (ref 6–24)
Bilirubin Total: 0.3 mg/dL (ref 0.0–1.2)
CHLORIDE: 100 mmol/L (ref 96–106)
CO2: 24 mmol/L (ref 20–29)
Calcium: 9.7 mg/dL (ref 8.7–10.2)
Creatinine, Ser: 1.17 mg/dL (ref 0.76–1.27)
GFR calc non Af Amer: 68 mL/min/{1.73_m2} (ref >59)
GFR, EST AFRICAN AMERICAN: 79 mL/min/{1.73_m2} (ref >59)
GLUCOSE: 82 mg/dL (ref 65–99)
Globulin, Total: 2.7 g/dL (ref 1.5–4.5)
POTASSIUM: 4.8 mmol/L (ref 3.5–5.2)
SODIUM: 137 mmol/L (ref 134–144)
TOTAL PROTEIN: 7.3 g/dL (ref 6.0–8.5)

## 2018-01-10 LAB — CBC WITH DIFFERENTIAL/PLATELET
BASOS ABS: 0.1 10*3/uL (ref 0.0–0.2)
BASOS: 1 %
EOS (ABSOLUTE): 0.1 10*3/uL (ref 0.0–0.4)
Eos: 2 %
HEMOGLOBIN: 14.7 g/dL (ref 13.0–17.7)
Hematocrit: 44.6 % (ref 37.5–51.0)
Immature Grans (Abs): 0 10*3/uL (ref 0.0–0.1)
Immature Granulocytes: 0 %
Lymphocytes Absolute: 1.6 10*3/uL (ref 0.7–3.1)
Lymphs: 29 %
MCH: 29.2 pg (ref 26.6–33.0)
MCHC: 33 g/dL (ref 31.5–35.7)
MCV: 89 fL (ref 79–97)
MONOCYTES: 7 %
Monocytes Absolute: 0.4 10*3/uL (ref 0.1–0.9)
NEUTROS ABS: 3.4 10*3/uL (ref 1.4–7.0)
Neutrophils: 61 %
Platelets: 299 10*3/uL (ref 150–450)
RBC: 5.04 x10E6/uL (ref 4.14–5.80)
RDW: 12.6 % (ref 12.3–15.4)
WBC: 5.5 10*3/uL (ref 3.4–10.8)

## 2018-01-10 LAB — LIPID PANEL
CHOLESTEROL TOTAL: 218 mg/dL — AB (ref 100–199)
Chol/HDL Ratio: 2.9 ratio (ref 0.0–5.0)
HDL: 75 mg/dL (ref >39)
LDL Calculated: 128 mg/dL — ABNORMAL HIGH (ref 0–99)
Triglycerides: 76 mg/dL (ref 0–149)
VLDL Cholesterol Cal: 15 mg/dL (ref 5–40)

## 2018-01-10 LAB — TSH: TSH: 0.934 u[IU]/mL (ref 0.450–4.500)

## 2018-01-11 ENCOUNTER — Telehealth: Payer: Self-pay

## 2018-01-11 NOTE — Telephone Encounter (Signed)
-----   Message from Margo Common, [place] sent at 01/11/2018  8:22 AM EDT ----- Blood tests normal except LDL cholesterol elevated. Lower fats in diet and exercise 30-40 minutes 4-5 days a week. Continue Atorvastatin 40 mg qd. Recheck cholesterol in 3 months.

## 2018-01-11 NOTE — Telephone Encounter (Signed)
Patient has been advised. KW 

## 2018-01-17 NOTE — Telephone Encounter (Signed)
Pt checking back on his referral to [facility].  NOT the school but the clinic.  Please call pt back for the status of referral.  Thanks, Marueno

## 2018-01-18 NOTE — Telephone Encounter (Signed)
I called the dental clinic and asked about the pt being referred to them and they stated that the pt isn't medically qualified to be seen at their clinic.  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  dbs

## 2018-02-05 ENCOUNTER — Encounter: Payer: Self-pay | Admitting: Family Medicine

## 2018-02-05 ENCOUNTER — Ambulatory Visit (INDEPENDENT_AMBULATORY_CARE_PROVIDER_SITE_OTHER): Payer: Medicare HMO | Admitting: Family Medicine

## 2018-02-05 ENCOUNTER — Ambulatory Visit
Admission: RE | Admit: 2018-02-05 | Discharge: 2018-02-05 | Disposition: A | Discharge status: Home / Self Care | Payer: Medicare HMO | Source: Ambulatory Visit | Attending: Family Medicine | Admitting: Family Medicine

## 2018-02-05 ENCOUNTER — Other Ambulatory Visit: Payer: Self-pay

## 2018-02-05 VITALS — BP 118/82 | HR 82 | Temp 97.6°F | Ht 72.0 in | Wt 208.6 lb

## 2018-02-05 DIAGNOSIS — M2022 Hallux rigidus, left foot: Secondary | ICD-10-CM | POA: Diagnosis not present

## 2018-02-05 DIAGNOSIS — M19072 Primary osteoarthritis, left ankle and foot: Secondary | ICD-10-CM | POA: Diagnosis not present

## 2018-02-05 DIAGNOSIS — M79675 Pain in left toe(s): Secondary | ICD-10-CM
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
## 2018-02-06 ENCOUNTER — Telehealth: Payer: Self-pay

## 2018-02-06 DIAGNOSIS — R69 Illness, unspecified: Secondary | ICD-10-CM | POA: Diagnosis not present

## 2018-02-06 LAB — CBC WITH DIFFERENTIAL/PLATELET
Basophils Absolute: 0 10*3/uL (ref 0.0–0.2)
Basos: 1 %
EOS (ABSOLUTE): 0.1 10*3/uL (ref 0.0–0.4)
Eos: 1 %
Hematocrit: 41.6 % (ref 37.5–51.0)
Hemoglobin: 13.7 g/dL (ref 13.0–17.7)
IMMATURE GRANULOCYTES: 0 %
Immature Grans (Abs): 0 10*3/uL (ref 0.0–0.1)
Lymphocytes Absolute: 1.7 10*3/uL (ref 0.7–3.1)
Lymphs: 28 %
MCH: 28.8 pg (ref 26.6–33.0)
MCHC: 32.9 g/dL (ref 31.5–35.7)
MCV: 88 fL (ref 79–97)
MONOS ABS: 0.5 10*3/uL (ref 0.1–0.9)
Monocytes: 8 %
NEUTROS PCT: 62 %
Neutrophils Absolute: 3.9 10*3/uL (ref 1.4–7.0)
PLATELETS: 293 10*3/uL (ref 150–450)
RBC: 4.75 x10E6/uL (ref 4.14–5.80)
RDW: 13.1 % (ref 12.3–15.4)
WBC: 6.2 10*3/uL (ref 3.4–10.8)

## 2018-02-06 LAB — URIC ACID: Uric Acid: 8.5 mg/dL (ref 3.7–8.6)

## 2018-02-06 LAB — SEDIMENTATION RATE: SED RATE: 18 mm/h (ref 0–30)

## 2018-02-06 MED ORDER — ALLOPURINOL 100 MG PO TABS: 100.0000 mg | ORAL_TABLET | Freq: Every day | ORAL | 6 refills | Status: DC

## 2018-02-06 NOTE — Telephone Encounter (Signed)
Patient advised. RX sent to [facility]. 2 month follow up scheduled.

## 2018-02-06 NOTE — Telephone Encounter (Signed)
-----   Message from Margo Common, [place] sent at 02/05/2018  5:44 PM EST ----- X-ray confirms degenerative changes at the first MTP joint of the left foot. Awaiting final report of labs and will proceed with podiatry referral.

## 2018-02-06 NOTE — Telephone Encounter (Signed)
-----   Message from Margo Common, [place] sent at 02/06/2018  8:06 AM EST ----- No sign of infection. Uric acid level higher than goal of <6.0. Recommend Allopurinol 100 mg qd #30 & 3 RF and recheck progress in 2 months. X-ray confirms degenerative changes at the first MTP joint of the left foot. Proceed with podiatry referral.

## 2018-02-20 ENCOUNTER — Other Ambulatory Visit: Payer: Self-pay | Admitting: Family Medicine

## 2018-02-20 DIAGNOSIS — I10 Essential (primary) hypertension: Secondary | ICD-10-CM

## 2018-04-06 NOTE — Progress Notes (Deleted)
       Patient: Anthony Hood Male    DOB: May 20, 1960   58 y.o.   MRN: [id number] Visit Date: 04/06/2018  Today's Provider: Vernie Murders, PA   No chief complaint on file.  Subjective:     HPI  Patient comes in today for follow-up.  Gout, Follow up:  The patient was last seen for Gout 2 months ago. Changes made since that visit include Allopironol 100 mg qd. Referral to podiatry for MTP Joint.  Xray showed degenerative changes at the first MTP joint of the left foot.  He reports {excellent/good/fair/poor:19665} compliance with treatment. He {ACTION; IS/IS [id number]} having side effects. ***.  ------------------------------------------------------------------------    No Known Allergies   Current Outpatient Medications:  .  allopurinol (ZYLOPRIM) 100 MG tablet, Take 1 tablet (100 mg total) by mouth daily., Disp: 30 tablet, Rfl: 6 .  atorvastatin (LIPITOR) 40 MG tablet, Take 1 tablet (40 mg total) by mouth daily., Disp: 90 tablet, Rfl: 3 .  enalapril (VASOTEC) 5 MG tablet, TAKE 1 TABLET BY MOUTH ONCE DAILY (PATIENT  DUE  FOR  FOLLOW  UP  APPOINTMENT  AND  LABS), Disp: 90 tablet, Rfl: 0  Review of Systems  Social History   Tobacco Use  . Smoking status: Never Smoker  . Smokeless tobacco: Never Used  Substance Use Topics  . Alcohol use: Yes    Comment: occational      Objective:   There were no vitals taken for this visit. There were no vitals filed for this visit.   Physical Exam      Assessment & Plan        Vernie Murders, PA  [facility]

## 2018-04-09 ENCOUNTER — Ambulatory Visit: Payer: Medicare HMO | Admitting: Family Medicine

## 2018-06-22 ENCOUNTER — Ambulatory Visit: Payer: Medicare Other

## 2018-06-22 ENCOUNTER — Encounter: Payer: Medicare Other | Admitting: Family Medicine

## 2018-08-21 ENCOUNTER — Ambulatory Visit (INDEPENDENT_AMBULATORY_CARE_PROVIDER_SITE_OTHER): Payer: Medicare Other | Admitting: Family Medicine

## 2018-08-21 ENCOUNTER — Encounter: Payer: Self-pay | Admitting: Family Medicine

## 2018-08-21 ENCOUNTER — Other Ambulatory Visit: Payer: Self-pay

## 2018-08-21 VITALS — BP 100/60 | HR 70 | Temp 98.3°F | Wt 209.0 lb

## 2018-08-21 DIAGNOSIS — Z0000 Encounter for general adult medical examination without abnormal findings: Secondary | ICD-10-CM | POA: Diagnosis not present

## 2018-08-21 DIAGNOSIS — Z125 Encounter for screening for malignant neoplasm of prostate: Secondary | ICD-10-CM | POA: Diagnosis not present

## 2018-08-21 DIAGNOSIS — I10 Essential (primary) hypertension: Secondary | ICD-10-CM

## 2018-08-21 DIAGNOSIS — M109 Gout, unspecified: Secondary | ICD-10-CM

## 2018-08-21 DIAGNOSIS — M10072 Idiopathic gout, left ankle and foot: Secondary | ICD-10-CM

## 2018-08-21 DIAGNOSIS — E782 Mixed hyperlipidemia: Secondary | ICD-10-CM

## 2018-08-21 HISTORY — DX: Gout, unspecified: M10.9

## 2018-08-21 NOTE — Progress Notes (Signed)
Patient: Anthony Hood, Male    DOB: [birth year], 58 y.o.   MRN: [id number] Visit Date: 08/21/2018  Today's Provider: Vernie Murders, PA   Chief Complaint  Patient presents with  . Annual Exam   Subjective:  Anthony Hood is a 58 y.o. male who presents today for health maintenance and complete physical. He feels well. He reports exercising by walking daily (1 mile to post office and back home). He reports he is sleeping well (8 hours a night).  Immunization History  Administered Date(s) Administered  . Influenza Split 01/18/2010  . Influenza,inj,Quad PF,6+ Mos 01/03/2013, 12/16/2013, 05/19/2016, 01/09/2018  . Tdap 05/19/2016   07/20/15 Colonoscopy, Elliott-Tubular adenomas, colonic mucosa with prominent lymphoid aggregate and focal hyperplastic epithelial change.   Review of Systems  Constitutional: Negative.   HENT: Negative.   Eyes: Negative.   Respiratory: Negative.   Cardiovascular: Negative.   Gastrointestinal: Negative.   Endocrine: Negative.   Genitourinary: Negative.   Musculoskeletal: Positive for back pain.  Skin: Negative.   Allergic/Immunologic: Negative.   Neurological: Negative.   Hematological: Negative.   Psychiatric/Behavioral: Negative.     Social History   Socioeconomic History  . Marital status: Married    Spouse name: Not on file  . Number of children: Not on file  . Years of education: Not on file  . Highest education level: Not on file  Occupational History  . Not on file  Social Needs  . Financial resource strain: Not on file  . Food insecurity:    Worry: Not on file    Inability: Not on file  . Transportation needs:    Medical: Not on file    Non-medical: Not on file  Tobacco Use  . Smoking status: Never Smoker  . Smokeless tobacco: Never Used  Substance and Sexual Activity  . Alcohol use: Yes    Comment: occational  . Drug use: No  . Sexual activity: Not on file  Lifestyle  . Physical activity:    Days per week: Not on file     Minutes per session: Not on file  . Stress: Not on file  Relationships  . Social connections:    Talks on phone: Not on file    Gets together: Not on file    Attends religious service: Not on file    Active member of club or organization: Not on file    Attends meetings of clubs or organizations: Not on file    Relationship status: Not on file  . Intimate partner violence:    Fear of current or ex partner: Not on file    Emotionally abused: Not on file    Physically abused: Not on file    Forced sexual activity: Not on file  Other Topics Concern  . Not on file  Social History Narrative  . Not on file    Patient Active Problem List   Diagnosis Date Noted  . Dyslexia 05/19/2016  . Essential (primary) hypertension 04/24/2015  . Fam hx-ischem heart disease 10/21/2008  . HLD (hyperlipidemia) 01/11/2007    Past Surgical History:  Procedure Laterality Date  . COLONOSCOPY WITH PROPOFOL N/A 07/20/2015   Procedure: COLONOSCOPY WITH PROPOFOL;  Surgeon: Manya Silvas, MD;  Location: [facility] ENDOSCOPY;  Service: Endoscopy;  Laterality: N/A;  . HERNIA REPAIR     umbilical  . SHOULDER SURGERY      His family history includes Heart disease in his father; Hypertension in his sister; Seizures in his sister.     Outpatient Encounter  Medications as of 08/21/2018  Medication Sig  . allopurinol (ZYLOPRIM) 100 MG tablet Take 1 tablet (100 mg total) by mouth daily.  Marland Kitchen atorvastatin (LIPITOR) 40 MG tablet Take 1 tablet (40 mg total) by mouth daily.  . enalapril (VASOTEC) 5 MG tablet TAKE 1 TABLET BY MOUTH ONCE DAILY (PATIENT  DUE  FOR  FOLLOW  UP  APPOINTMENT  AND  LABS)   No facility-administered encounter medications on file as of 08/21/2018.    Patient Care Team: Gervase Colberg, Vickki Muff, PA as PCP - General (Family Medicine)     Objective:   Vitals:  Vitals:   08/21/18 1054  BP: 100/60  Pulse: 70  Temp: 98.3 F (36.8 C)  TempSrc: Oral  SpO2: 99%  Weight: 209 lb (94.8 kg)   Physical  Exam Constitutional:      Appearance: He is well-developed.  HENT:     Head: Normocephalic and atraumatic.     Right Ear: External ear normal.     Left Ear: External ear normal.     Nose: Nose normal.  Eyes:     General:        Right eye: No discharge.     Conjunctiva/sclera: Conjunctivae normal.     Pupils: Pupils are equal, round, and reactive to light.  Neck:     Musculoskeletal: Normal range of motion and neck supple.     Thyroid: No thyromegaly.     Trachea: No tracheal deviation.  Cardiovascular:     Rate and Rhythm: Normal rate and regular rhythm.     Heart sounds: Normal heart sounds. No murmur.  Pulmonary:     Effort: Pulmonary effort is normal. No respiratory distress.     Breath sounds: Normal breath sounds. No wheezing or rales.  Chest:     Chest wall: No tenderness.  Abdominal:     General: There is no distension.     Palpations: Abdomen is soft. There is no mass.     Tenderness: There is no abdominal tenderness. There is no guarding or rebound.  Genitourinary:    Penis: Normal.      Scrotum/Testes: Normal.     Prostate: Normal.     Rectum: Normal. Guaiac result negative.  Musculoskeletal: Normal range of motion.        General: Tenderness present.     Comments: Slight redness and pain to plantar flex the left foot first MTP joint. Irregular and gray great toe nail. Good pulses and sensation.  Lymphadenopathy:     Cervical: No cervical adenopathy.  Skin:    General: Skin is warm and dry.     Findings: No erythema or rash.  Neurological:     Mental Status: He is alert and oriented to person, place, and time.     Cranial Nerves: No cranial nerve deficit.     Motor: No abnormal muscle tone.     Coordination: Coordination normal.     Deep Tendon Reflexes: Reflexes are normal and symmetric. Reflexes normal.  Psychiatric:        Behavior: Behavior normal.        Thought Content: Thought content normal.        Judgment: Judgment normal.     Comments: Dyslexia  and unable to hold down a job in his adult life. Unable to read or write.     Depression Screen PHQ 2/9 Scores 08/21/2018 02/05/2018 06/12/2017 05/19/2016  PHQ - 2 Score 0 0 2 0  PHQ- 9 Score - - 2 0  Fall Risk  08/21/2018 02/05/2018 06/12/2017 05/19/2016 05/18/2015  Falls in the past year? 0 0 No No No   Functional Status Survey: Is the patient deaf or have difficulty hearing?: No Does the patient have difficulty seeing, even when wearing glasses/contacts?: No Does the patient have difficulty concentrating, remembering, or making decisions?: No Does the patient have difficulty walking or climbing stairs?: No Does the patient have difficulty dressing or bathing?: No Does the patient have difficulty doing errands alone such as visiting a doctor's office or shopping?: No     Office Visit from 08/21/2018 in [place]  AUDIT-C Score  5      Assessment & Plan:     Routine Health Maintenance and Physical Exam  Exercise Activities and Dietary recommendations Goals   Encouraged to continue to walk a mile a day for exercise.     Immunization History  Administered Date(s) Administered  . Influenza Split 01/18/2010  . Influenza,inj,Quad PF,6+ Mos 01/03/2013, 12/16/2013, 05/19/2016, 01/09/2018  . Tdap 05/19/2016    Health Maintenance  Topic Date Due  . INFLUENZA VACCINE  10/20/2018  . COLONOSCOPY  07/19/2025  . TETANUS/TDAP  05/20/2026  . Hepatitis C Screening  Completed  . HIV Screening  Completed    Discussed health benefits of physical activity, and encouraged him to engage in regular exercise appropriate for his age and condition.    1. Annual physical exam General health good. Immunizations and colonoscopy up to date. Has been disabled for years due to severe dyslexia keeping him from working since he was a child. Given anticipatory counseling and recheck of routine labs.  2. Prostate cancer screening Normal DRE today. Denies frequency, nocturia, hesitancy or  decreased stream. Recheck PSA. - PSA  3. Mixed hyperlipidemia Tolerating the Atorvastatin 40 mg qd without muscle aches or pains. Should continue low fat diet and recheck CMP, Lipid Panel and TSH. - Comprehensive metabolic panel - Lipid Panel With LDL/HDL Ratio - TSH  4. Essential (primary) hypertension BP well controlled with the Enalapril 5 mg qd. No chest pains, edema, dyspnea or palpitations. Recheck labs and follow up pending reports. - CBC with Differential/Platelet - Comprehensive metabolic panel - TSH  5. Idiopathic gout involving toe of left foot, unspecified chronicity Has been on Allopurinol 100 mg qd since 02-06-18 when uric acid level was 8.5. May use Ibuprofen or Naproxen prn inflammation. Should stop all ETOH as it can trigger gout attacks. - Uric acid

## 2018-08-22 LAB — CBC WITH DIFFERENTIAL/PLATELET
Basophils Absolute: 0 10*3/uL (ref 0.0–0.2)
Basos: 1 %
EOS (ABSOLUTE): 0.1 10*3/uL (ref 0.0–0.4)
Eos: 2 %
Hematocrit: 42.1 % (ref 37.5–51.0)
Hemoglobin: 13.4 g/dL (ref 13.0–17.7)
Immature Grans (Abs): 0 10*3/uL (ref 0.0–0.1)
Immature Granulocytes: 1 %
Lymphocytes Absolute: 1.7 10*3/uL (ref 0.7–3.1)
Lymphs: 32 %
MCH: 28.2 pg (ref 26.6–33.0)
MCHC: 31.8 g/dL (ref 31.5–35.7)
MCV: 88 fL (ref 79–97)
Monocytes Absolute: 0.5 10*3/uL (ref 0.1–0.9)
Monocytes: 10 %
Neutrophils Absolute: 2.9 10*3/uL (ref 1.4–7.0)
Neutrophils: 54 %
Platelets: 270 10*3/uL (ref 150–450)
RBC: 4.76 x10E6/uL (ref 4.14–5.80)
RDW: 13.2 % (ref 11.6–15.4)
WBC: 5.2 10*3/uL (ref 3.4–10.8)

## 2018-08-22 LAB — LIPID PANEL WITH LDL/HDL RATIO
Cholesterol, Total: 210 mg/dL — ABNORMAL HIGH (ref 100–199)
HDL: 77 mg/dL (ref >39)
LDL Calculated: 118 mg/dL — ABNORMAL HIGH (ref 0–99)
LDl/HDL Ratio: 1.5 ratio (ref 0.0–3.6)
Triglycerides: 76 mg/dL (ref 0–149)
VLDL Cholesterol Cal: 15 mg/dL (ref 5–40)

## 2018-08-22 LAB — COMPREHENSIVE METABOLIC PANEL
ALT: 34 IU/L (ref 0–44)
AST: 31 IU/L (ref 0–40)
Albumin/Globulin Ratio: 2 (ref 1.2–2.2)
Albumin: 4.8 g/dL (ref 3.8–4.9)
Alkaline Phosphatase: 56 IU/L (ref 39–117)
BUN/Creatinine Ratio: 12 (ref 9–20)
BUN: 15 mg/dL (ref 6–24)
Bilirubin Total: 0.6 mg/dL (ref 0.0–1.2)
CO2: 22 mmol/L (ref 20–29)
Calcium: 9.6 mg/dL (ref 8.7–10.2)
Chloride: 99 mmol/L (ref 96–106)
Creatinine, Ser: 1.21 mg/dL (ref 0.76–1.27)
GFR calc Af Amer: 76 mL/min/{1.73_m2} (ref >59)
GFR calc non Af Amer: 66 mL/min/{1.73_m2} (ref >59)
Globulin, Total: 2.4 g/dL (ref 1.5–4.5)
Glucose: 94 mg/dL (ref 65–99)
Potassium: 4.5 mmol/L (ref 3.5–5.2)
Sodium: 136 mmol/L (ref 134–144)
Total Protein: 7.2 g/dL (ref 6.0–8.5)

## 2018-08-22 LAB — TSH: TSH: 1.3 u[IU]/mL (ref 0.450–4.500)

## 2018-08-22 LAB — PSA: Prostate Specific Ag, Serum: 0.6 ng/mL (ref 0.0–4.0)

## 2018-08-23 ENCOUNTER — Other Ambulatory Visit: Payer: Self-pay | Admitting: Family Medicine

## 2018-08-23 DIAGNOSIS — I10 Essential (primary) hypertension: Secondary | ICD-10-CM

## 2018-08-24 ENCOUNTER — Telehealth: Payer: Self-pay

## 2018-08-24 DIAGNOSIS — E782 Mixed hyperlipidemia: Secondary | ICD-10-CM

## 2018-08-24 MED ORDER — PRAVASTATIN SODIUM 20 MG PO TABS: 20.0000 mg | ORAL_TABLET | Freq: Every day | ORAL | 3 refills | Status: DC

## 2018-08-24 NOTE — Telephone Encounter (Signed)
Advised patient of results. Medication was sent into the pharmacy.  

## 2018-08-24 NOTE — Telephone Encounter (Signed)
-----   Message from Margo Common, [place] sent at 08/24/2018  9:18 AM EDT ----- All tests normal except LDL cholesterol still elevated (but better than last checks). Recommend Pravastatin 20 mg qd #30 & 3 RF. Recheck level in 3 months. Awaiting uric acid level report for follow up of gout.

## 2018-08-28 DIAGNOSIS — M19072 Primary osteoarthritis, left ankle and foot: Secondary | ICD-10-CM | POA: Diagnosis not present

## 2018-08-28 DIAGNOSIS — M869 Osteomyelitis, unspecified: Secondary | ICD-10-CM | POA: Diagnosis not present

## 2018-11-27 ENCOUNTER — Other Ambulatory Visit: Payer: Self-pay

## 2018-11-27 NOTE — Patient Outreach (Signed)
[facility]) Care Management  11/27/2018  Brandonlee Donica Jun 25, 1960 [id number]  Medication Adherence call to Mr. Hall Busing HIPPA Compliant Voice message left with a call back number. Mr. Deats is showing past due on Enalepril 5 mg and Pravastatin 20 mg under [place].   [place] Management Direct Dial [phone]  Fax [phone] [email or web address]

## 2018-12-11 DIAGNOSIS — M19072 Primary osteoarthritis, left ankle and foot: Secondary | ICD-10-CM | POA: Diagnosis not present

## 2018-12-25 ENCOUNTER — Other Ambulatory Visit: Payer: Self-pay

## 2018-12-25 NOTE — Patient Outreach (Signed)
[facility]) Care Management  12/25/2018  Dhruva Fearnley January 27, 1961 [id number]   Medication Adherence call to [place] Compliant Voice message left with a call back number. Mr. Gedeon is showing past due on Pravastatin 20 mg and Enalapril 5 mg under [place].   [place] Management Direct Dial [phone]  Fax [phone] [email or web address]

## 2019-01-31 ENCOUNTER — Other Ambulatory Visit: Payer: Self-pay | Admitting: Family Medicine

## 2019-01-31 DIAGNOSIS — E782 Mixed hyperlipidemia: Secondary | ICD-10-CM

## 2019-02-08 ENCOUNTER — Ambulatory Visit (INDEPENDENT_AMBULATORY_CARE_PROVIDER_SITE_OTHER): Payer: Medicare Other | Admitting: Family Medicine

## 2019-02-08 ENCOUNTER — Other Ambulatory Visit: Payer: Self-pay

## 2019-02-08 DIAGNOSIS — Z23 Encounter for immunization: Secondary | ICD-10-CM

## 2019-04-19 ENCOUNTER — Ambulatory Visit: Payer: 59 | Attending: Internal Medicine

## 2019-04-19 DIAGNOSIS — Z20822 Contact with and (suspected) exposure to covid-19: Secondary | ICD-10-CM

## 2019-04-20 LAB — NOVEL CORONAVIRUS, NAA: SARS-CoV-2, NAA: NOT DETECTED

## 2019-04-22 ENCOUNTER — Telehealth: Payer: Self-pay

## 2019-04-22 NOTE — Telephone Encounter (Signed)
Pt. Given COVID 19 results, verbalizes understanding. 

## 2019-06-06 ENCOUNTER — Other Ambulatory Visit: Payer: Self-pay | Admitting: Family Medicine

## 2019-06-06 DIAGNOSIS — E782 Mixed hyperlipidemia: Secondary | ICD-10-CM

## 2019-06-06 DIAGNOSIS — I10 Essential (primary) hypertension: Secondary | ICD-10-CM

## 2019-06-06 MED ORDER — ENALAPRIL MALEATE 5 MG PO TABS: 5.0000 mg | ORAL_TABLET | Freq: Every day | ORAL | 3 refills | Status: DC

## 2019-06-06 MED ORDER — PRAVASTATIN SODIUM 20 MG PO TABS: 20.0000 mg | ORAL_TABLET | Freq: Every day | ORAL | 3 refills | Status: DC

## 2019-06-06 NOTE — Telephone Encounter (Signed)
[facility] faxed refill request for the following medications:  enalapril (VASOTEC)  tablet pravastatin sod tablet  Please advise.  Thanks, [employer]

## 2019-06-07 ENCOUNTER — Other Ambulatory Visit: Payer: Self-pay | Admitting: Family Medicine

## 2019-07-01 ENCOUNTER — Other Ambulatory Visit: Payer: Self-pay

## 2019-07-01 ENCOUNTER — Ambulatory Visit: Payer: 59 | Attending: Internal Medicine

## 2019-07-01 DIAGNOSIS — Z23 Encounter for immunization: Secondary | ICD-10-CM

## 2019-07-01 NOTE — Progress Notes (Signed)
   Covid-19 Vaccination Clinic  Name:  Theoren Schramm    MRN: [id number] DOB: [birth year]  07/01/2019  Mr. Punsalan was observed post Covid-19 immunization for 15 minutes without incident. He was provided with Vaccine Information Sheet and instruction to access the V-Safe system.   Mr. Westmeyer was instructed to call 911 with any severe reactions post vaccine: Marland Kitchen Difficulty breathing  . Swelling of face and throat  . A fast heartbeat  . A bad rash all over body  . Dizziness and weakness   Immunizations Administered    Name Date Dose VIS Date Route   Pfizer COVID-19 Vaccine 07/01/2019  8:07 AM 0.3 mL 03/01/2019 Intramuscular   Manufacturer: [place]   Lot: [id number]   [place]: [id number]

## 2019-07-02 DIAGNOSIS — M19072 Primary osteoarthritis, left ankle and foot: Secondary | ICD-10-CM | POA: Diagnosis not present

## 2019-07-24 ENCOUNTER — Ambulatory Visit: Payer: 59 | Attending: Internal Medicine

## 2019-07-24 DIAGNOSIS — Z23 Encounter for immunization: Secondary | ICD-10-CM

## 2019-07-24 NOTE — Progress Notes (Signed)
   Covid-19 Vaccination Clinic  Name:  Anthony Hood    MRN: [id number] DOB: Jun 23, 1960  07/24/2019  [name] was observed post Covid-19 immunization for 15 minutes without incident. He was provided with Vaccine Information Sheet and instruction to access the V-Safe system.   [name] was instructed to call 911 with any severe reactions post vaccine: Marland Kitchen Difficulty breathing  . Swelling of face and throat  . A fast heartbeat  . A bad rash all over body  . Dizziness and weakness   Immunizations Administered    Name Date Dose VIS Date Route   Pfizer COVID-19 Vaccine 07/24/2019 10:29 AM 0.3 mL 05/15/2018 Intramuscular   Manufacturer: [place]   Lot: [id number]   [place]: [id number]

## 2019-08-20 DIAGNOSIS — H52223 Regular astigmatism, bilateral: Secondary | ICD-10-CM | POA: Diagnosis not present

## 2019-08-20 DIAGNOSIS — H524 Presbyopia: Secondary | ICD-10-CM | POA: Diagnosis not present

## 2019-08-20 DIAGNOSIS — H5213 Myopia, bilateral: Secondary | ICD-10-CM | POA: Diagnosis not present

## 2019-08-20 NOTE — Progress Notes (Deleted)
Complete physical exam   Patient: Anthony Hood   DOB: 05-19-1960   59 y.o. Male  MRN: [id number] Visit Date: 08/22/2019  Today's healthcare provider: Vernie Murders, PA   No chief complaint on file.  Subjective    Anthony Hood is a 59 y.o. male who presents today for a complete physical exam.  He reports consuming a {diet types:17450} diet. {Exercise:19826} He generally feels {well/fairly well/poorly:18703}. He reports sleeping {well/fairly well/poorly:18703}. He {does/does not:200015} have additional problems to discuss today.  HPI    Immunization History  Administered Date(s) Administered  . Influenza Split 01/18/2010  . Influenza,inj,Quad PF,6+ Mos 01/03/2013, 12/16/2013, 05/19/2016, 01/09/2018, 02/08/2019  . PFIZER SARS-COV-2 Vaccination 07/01/2019, 07/24/2019  . Tdap 05/19/2016     Past Medical History:  Diagnosis Date  . Hyperlipidemia   . Hypertension    Past Surgical History:  Procedure Laterality Date  . COLONOSCOPY WITH PROPOFOL N/A 07/20/2015   Procedure: COLONOSCOPY WITH PROPOFOL;  Surgeon: Manya Silvas, MD;  Location: [facility] ENDOSCOPY;  Service: Endoscopy;  Laterality: N/A;  . HERNIA REPAIR     umbilical  . SHOULDER SURGERY     Social History   Socioeconomic History  . Marital status: Married    Spouse name: Not on file  . Number of children: Not on file  . Years of education: Not on file  . Highest education level: Not on file  Occupational History  . Not on file  Tobacco Use  . Smoking status: Never Smoker  . Smokeless tobacco: Never Used  Substance and Sexual Activity  . Alcohol use: Yes    Comment: occational  . Drug use: No  . Sexual activity: Not on file  Other Topics Concern  . Not on file  Social History Narrative  . Not on file   Social Determinants of Health   Financial Resource Strain:   . Difficulty of Paying Living Expenses:   Food Insecurity:   . Worried About Charity fundraiser in the Last Year:   . Academic librarian in the Last Year:   Transportation Needs:   . Film/video editor (Medical):   Marland Kitchen Lack of Transportation (Non-Medical):   Physical Activity:   . Days of Exercise per Week:   . Minutes of Exercise per Session:   Stress:   . Feeling of Stress :   Social Connections:   . Frequency of Communication with Friends and Family:   . Frequency of Social Gatherings with Friends and Family:   . Attends Religious Services:   . Active Member of Clubs or Organizations:   . Attends Archivist Meetings:   Marland Kitchen Marital Status:   Intimate Partner Violence:   . Fear of Current or Ex-Partner:   . Emotionally Abused:   Marland Kitchen Physically Abused:   . Sexually Abused:    Family Status  Relation Name Status  . Mother  Deceased  . Father  Deceased  . Sister  Alive  . Brother  Alive  . Sister  Alive  . Sister  Alive  . Sister  Alive  . Sister  Alive  . Sister  Alive  . Brother  Alive   Family History  Problem Relation Age of Onset  . Heart disease Father   . Hypertension Sister   . Seizures Sister    No Known Allergies  Patient Care Team: Chrismon, Vickki Muff, PA as PCP - General (Family Medicine)   Medications: Outpatient Medications Prior to Visit  Medication  Sig  . allopurinol (ZYLOPRIM) 100 MG tablet Take 1 tablet (100 mg total) by mouth daily.  . enalapril (VASOTEC) 5 MG tablet Take 1 tablet (5 mg total) by mouth daily.  . pravastatin (PRAVACHOL) 20 MG tablet Take 1 tablet (20 mg total) by mouth daily.   No facility-administered medications prior to visit.    Review of Systems  Constitutional: Negative.   HENT: Negative.   Eyes: Negative.   Respiratory: Negative.   Cardiovascular: Negative.   Gastrointestinal: Negative.   Endocrine: Negative.   Genitourinary: Negative.   Musculoskeletal: Negative.   Skin: Negative.   Allergic/Immunologic: Negative.   Neurological: Negative.   Hematological: Negative.   Psychiatric/Behavioral: Negative.     {Heme  Chem   Endocrine  Serology  Results Review (optional):23779::" "}  Objective    There were no vitals taken for this visit. {Show previous vital signs (optional):23777::" "}  Physical Exam Constitutional:      Appearance: Normal appearance. He is normal weight.  HENT:     Head: Normocephalic and atraumatic.     Right Ear: Tympanic membrane, ear canal and external ear normal.     Left Ear: Tympanic membrane, ear canal and external ear normal.     Nose: Nose normal.     Mouth/Throat:     Mouth: Mucous membranes are moist.     Pharynx: Oropharynx is clear.  Eyes:     Extraocular Movements: Extraocular movements intact.     Conjunctiva/sclera: Conjunctivae normal.     Pupils: Pupils are equal, round, and reactive to light.  Cardiovascular:     Rate and Rhythm: Normal rate and regular rhythm.     Pulses: Normal pulses.     Heart sounds: Normal heart sounds.  Pulmonary:     Effort: Pulmonary effort is normal.     Breath sounds: Normal breath sounds.  Abdominal:     General: Abdomen is flat. Bowel sounds are normal.     Palpations: Abdomen is soft.  Musculoskeletal:        General: Normal range of motion.     Cervical back: Normal range of motion and neck supple.  Skin:    General: Skin is warm and dry.  Neurological:     General: No focal deficit present.     Mental Status: He is alert and oriented to person, place, and time. Mental status is at baseline.  Psychiatric:        Mood and Affect: Mood normal.        Behavior: Behavior normal.        Thought Content: Thought content normal.        Judgment: Judgment normal.      Depression Screen  PHQ 2/9 Scores 08/21/2018 02/05/2018 06/12/2017  PHQ - 2 Score 0 0 2  PHQ- 9 Score - - 2   Fall Risk  08/21/2018 02/05/2018 06/12/2017 05/19/2016 05/18/2015  Falls in the past year? 0 0 No No No     Functional Status Survey:       Office Visit from 08/21/2018 in [facility]  AUDIT-C Score  5         No results found for  any visits on 08/22/19.  Assessment & Plan    Routine Health Maintenance and Physical Exam  Exercise Activities and Dietary recommendations Goals   None     Immunization History  Administered Date(s) Administered  . Influenza Split 01/18/2010  . Influenza,inj,Quad PF,6+ Mos 01/03/2013, 12/16/2013, 05/19/2016, 01/09/2018, 02/08/2019  . PFIZER  SARS-COV-2 Vaccination 07/01/2019, 07/24/2019  . Tdap 05/19/2016    Health Maintenance  Topic Date Due  . INFLUENZA VACCINE  10/20/2019  . COLONOSCOPY  07/19/2025  . TETANUS/TDAP  05/20/2026  . COVID-19 Vaccine  Completed  . Hepatitis C Screening  Completed  . HIV Screening  Completed    Discussed health benefits of physical activity, and encouraged him to engage in regular exercise appropriate for his age and condition.  ***  No follow-ups on file.     {provider attestation***:1}   Vernie Murders, [place] [phone] (phone) [phone] (fax)  [place]

## 2019-08-22 ENCOUNTER — Encounter: Payer: Medicare Other | Admitting: Family Medicine

## 2019-08-22 NOTE — Progress Notes (Deleted)
Complete physical exam   Patient: Anthony Hood   DOB: September 11, 1960   59 y.o. Male  MRN: [id number] Visit Date: 08/23/2019  Today's healthcare provider: Vernie Murders, PA   No chief complaint on file.  Subjective    Anthony Hood is a 59 y.o. male who presents today for a complete physical exam.  He reports consuming a {diet types:17450} diet. {Exercise:19826} He generally feels {well/fairly well/poorly:18703}. He reports sleeping {well/fairly well/poorly:18703}. He {does/does not:200015} have additional problems to discuss today.  HPI   Immunization History  Administered Date(s) Administered  . Influenza Split 01/18/2010  . Influenza,inj,Quad PF,6+ Mos 01/03/2013, 12/16/2013, 05/19/2016, 01/09/2018, 02/08/2019  . PFIZER SARS-COV-2 Vaccination 07/01/2019, 07/24/2019  . Tdap 05/19/2016   Adult vaccines due  Topic Date Due  . TETANUS/TDAP  05/20/2026  There are no preventive care reminders to display for this patient.  The measurement period is the current fiscal year.  Focus Metric Due  Adult BMI Assessment and Follow-up Current status:  PATIENT NEEDS WEIGHT DOCUMENTED TO CALCULATE CURRENT BMI  Depression screen [facility] 2/9 08/21/2018 02/05/2018 06/12/2017 05/19/2016 05/18/2015  Decreased Interest 0 0 2 0 0  Down, Depressed, Hopeless 0 0 0 0 0  PHQ - 2 Score 0 0 2 0 0  Altered sleeping - - 0 0 -  Tired, decreased energy - - 0 0 -  Change in appetite - - 0 0 -  Feeling bad or failure about yourself  - - 0 0 -  Trouble concentrating - - 0 0 -  Moving slowly or fidgety/restless - - 0 0 -  Suicidal thoughts - - 0 0 -  PHQ-9 Score - - 2 0 -  Difficult doing work/chores - - Not difficult at all - -   Fall Risk  08/21/2018 02/05/2018 06/12/2017 05/19/2016 05/18/2015  Falls in the past year? 0 0 No No No     Office Visit from 08/21/2018 in [place]  AUDIT-C Score  5     Functional Status Survey:     Past Medical History:  Diagnosis Date  .  Hyperlipidemia   . Hypertension    Past Surgical History:  Procedure Laterality Date  . COLONOSCOPY WITH PROPOFOL N/A 07/20/2015   Procedure: COLONOSCOPY WITH PROPOFOL;  Surgeon: Manya Silvas, MD;  Location: [facility] ENDOSCOPY;  Service: Endoscopy;  Laterality: N/A;  . HERNIA REPAIR     umbilical  . SHOULDER SURGERY     Social History   Socioeconomic History  . Marital status: Married    Spouse name: Not on file  . Number of children: Not on file  . Years of education: Not on file  . Highest education level: Not on file  Occupational History  . Not on file  Tobacco Use  . Smoking status: Never Smoker  . Smokeless tobacco: Never Used  Substance and Sexual Activity  . Alcohol use: Yes    Comment: occational  . Drug use: No  . Sexual activity: Not on file  Other Topics Concern  . Not on file  Social History Narrative  . Not on file   Social Determinants of Health   Financial Resource Strain:   . Difficulty of Paying Living Expenses:   Food Insecurity:   . Worried About Charity fundraiser in the Last Year:   . Arboriculturist in the Last Year:   Transportation Needs:   . Film/video editor (Medical):   Marland Kitchen Lack of Transportation (Non-Medical):   Physical  Activity:   . Days of Exercise per Week:   . Minutes of Exercise per Session:   Stress:   . Feeling of Stress :   Social Connections:   . Frequency of Communication with Friends and Family:   . Frequency of Social Gatherings with Friends and Family:   . Attends Religious Services:   . Active Member of Clubs or Organizations:   . Attends Archivist Meetings:   Marland Kitchen Marital Status:   Intimate Partner Violence:   . Fear of Current or Ex-Partner:   . Emotionally Abused:   Marland Kitchen Physically Abused:   . Sexually Abused:    Family Status  Relation Name Status  . Mother  Deceased  . Father  Deceased  . Sister  Alive  . Brother  Alive  . Sister  Alive  . Sister  Alive  . Sister  Alive  . Sister  Alive  .  Sister  Alive  . Brother  Alive   Family History  Problem Relation Age of Onset  . Heart disease Father   . Hypertension Sister   . Seizures Sister    No Known Allergies  Patient Care Team: Chrismon, Vickki Muff, PA as PCP - General (Family Medicine)   Medications: Outpatient Medications Prior to Visit  Medication Sig  . allopurinol (ZYLOPRIM) 100 MG tablet Take 1 tablet (100 mg total) by mouth daily.  . enalapril (VASOTEC) 5 MG tablet Take 1 tablet (5 mg total) by mouth daily.  . pravastatin (PRAVACHOL) 20 MG tablet Take 1 tablet (20 mg total) by mouth daily.   No facility-administered medications prior to visit.    Review of Systems  Constitutional: Negative.   HENT: Negative.   Eyes: Negative.   Respiratory: Negative.   Cardiovascular: Negative.   Gastrointestinal: Negative.   Endocrine: Negative.   Genitourinary: Negative.   Musculoskeletal: Negative.   Skin: Negative.   Allergic/Immunologic: Negative.   Neurological: Negative.   Hematological: Negative.   Psychiatric/Behavioral: Negative.     {Heme  Chem  Endocrine  Serology  Results Review (optional):23779::" "}  Objective    There were no vitals taken for this visit. {Show previous vital signs (optional):23777::" "}  Physical Exam Constitutional:      Appearance: Normal appearance. He is normal weight.  HENT:     Head: Normocephalic and atraumatic.     Right Ear: Tympanic membrane, ear canal and external ear normal.     Left Ear: Tympanic membrane, ear canal and external ear normal.     Nose: Nose normal.     Mouth/Throat:     Mouth: Mucous membranes are moist.     Pharynx: Oropharynx is clear.  Eyes:     Extraocular Movements: Extraocular movements intact.     Conjunctiva/sclera: Conjunctivae normal.     Pupils: Pupils are equal, round, and reactive to light.  Cardiovascular:     Rate and Rhythm: Normal rate and regular rhythm.     Pulses: Normal pulses.     Heart sounds: Normal heart sounds.    Pulmonary:     Effort: Pulmonary effort is normal.     Breath sounds: Normal breath sounds.  Abdominal:     General: Abdomen is flat. Bowel sounds are normal.     Palpations: Abdomen is soft.  Musculoskeletal:        General: Normal range of motion.     Cervical back: Normal range of motion and neck supple.  Skin:    General: Skin is warm  and dry.  Neurological:     General: No focal deficit present.     Mental Status: He is alert and oriented to person, place, and time. Mental status is at baseline.  Psychiatric:        Mood and Affect: Mood normal.        Behavior: Behavior normal.        Thought Content: Thought content normal.        Judgment: Judgment normal.      No results found for any visits on 08/23/19.  Assessment & Plan    Routine Health Maintenance and Physical Exam  Exercise Activities and Dietary recommendations Goals   None     Immunization History  Administered Date(s) Administered  . Influenza Split 01/18/2010  . Influenza,inj,Quad PF,6+ Mos 01/03/2013, 12/16/2013, 05/19/2016, 01/09/2018, 02/08/2019  . PFIZER SARS-COV-2 Vaccination 07/01/2019, 07/24/2019  . Tdap 05/19/2016    Health Maintenance  Topic Date Due  . INFLUENZA VACCINE  10/20/2019  . COLONOSCOPY  07/19/2025  . TETANUS/TDAP  05/20/2026  . COVID-19 Vaccine  Completed  . Hepatitis C Screening  Completed  . HIV Screening  Completed    Discussed health benefits of physical activity, and encouraged him to engage in regular exercise appropriate for his age and condition.  ***  No follow-ups on file.     {provider attestation***:1}   Vernie Murders, [place] [phone] (phone) [phone] (fax)  [place]

## 2019-08-23 ENCOUNTER — Encounter: Payer: Medicare Other | Admitting: Family Medicine

## 2019-09-04 NOTE — Progress Notes (Signed)
Complete physical exam   Patient: Anthony Hood   DOB: 16-Dec-1960   59 y.o. Male  MRN: [id number] Visit Date: 09/05/2019  Today's healthcare provider: Vernie Murders, PA   Chief Complaint  Patient presents with   Annual Exam   Subjective    Anthony Hood is a 59 y.o. male who presents today for a complete physical exam.  He reports consuming a general diet. The patient has a physically strenuous job, but has no regular exercise apart from work.  He generally feels well. He reports sleeping well. He does not have additional problems to discuss today.    Past Medical History:  Diagnosis Date   Hyperlipidemia    Hypertension    Past Surgical History:  Procedure Laterality Date   COLONOSCOPY WITH PROPOFOL N/A 07/20/2015   Procedure: COLONOSCOPY WITH PROPOFOL;  Surgeon: Manya Silvas, MD;  Location: [facility] ENDOSCOPY;  Service: Endoscopy;  Laterality: N/A;   HERNIA REPAIR     umbilical   SHOULDER SURGERY     Past Surgical History:  Procedure Laterality Date   COLONOSCOPY WITH PROPOFOL N/A 07/20/2015   Procedure: COLONOSCOPY WITH PROPOFOL;  Surgeon: Manya Silvas, MD;  Location: [facility] ENDOSCOPY;  Service: Endoscopy;  Laterality: N/A;   HERNIA REPAIR     umbilical   SHOULDER SURGERY     Social History   Socioeconomic History   Marital status: Married    Spouse name: Not on file   Number of children: Not on file   Years of education: Not on file   Highest education level: Not on file  Occupational History   Not on file  Tobacco Use   Smoking status: Never Smoker   Smokeless tobacco: Never Used  Vaping Use   Vaping Use: Never used  Substance and Sexual Activity   Alcohol use: Yes    Comment: occational   Drug use: No   Sexual activity: Not on file  Other Topics Concern   Not on file  Social History Narrative   Not on file   Social Determinants of Health   Financial Resource Strain:    Difficulty of Paying Living Expenses:   Food  Insecurity:    Worried About Charity fundraiser in the Last Year:    Arboriculturist in the Last Year:   Transportation Needs:    Film/video editor (Medical):    Lack of Transportation (Non-Medical):   Physical Activity:    Days of Exercise per Week:    Minutes of Exercise per Session:   Stress:    Feeling of Stress :   Social Connections:    Frequency of Communication with Friends and Family:    Frequency of Social Gatherings with Friends and Family:    Attends Religious Services:    Active Member of Clubs or Organizations:    Attends Music therapist:    Marital Status:   Intimate Partner Violence:    Fear of Current or Ex-Partner:    Emotionally Abused:    Physically Abused:    Sexually Abused:    Family Status  Relation Name Status   Mother  Deceased   Father  Deceased   Sister  Alive   Brother  Alive   Sister  Alive   Sister  Alive   Sister  Alive   Sister  Alive   Sister  Alive   Brother  Alive   Family History  Problem Relation Age of Onset   COPD  Mother    Heart disease Father    Hypertension Sister    Seizures Sister    No Known Allergies   Patient Care Team: Marquasha Brutus, Vickki Muff, PA as PCP - General (Family Medicine)   Medications: Outpatient Medications Prior to Visit  Medication Sig   allopurinol (ZYLOPRIM) 100 MG tablet Take 1 tablet (100 mg total) by mouth daily.   enalapril (VASOTEC) 5 MG tablet Take 1 tablet (5 mg total) by mouth daily.   pravastatin (PRAVACHOL) 20 MG tablet Take 1 tablet (20 mg total) by mouth daily.   No facility-administered medications prior to visit.    Review of Systems  Constitutional: Negative.   HENT: Negative.   Eyes: Positive for redness. Negative for photophobia, pain, discharge, itching and visual disturbance.  Respiratory: Negative.   Cardiovascular: Negative.   Gastrointestinal: Negative.   Endocrine: Negative.   Genitourinary: Negative.     Musculoskeletal: Negative.   Skin: Negative.   Allergic/Immunologic: Negative.   Neurological: Negative.   Hematological: Negative.   Psychiatric/Behavioral: Negative.     Last CBC Lab Results  Component Value Date   WBC 5.2 08/21/2018   HGB 13.4 08/21/2018   HCT 42.1 08/21/2018   MCV 88 08/21/2018   MCH 28.2 08/21/2018   RDW 13.2 08/21/2018   PLT 270 51/88/4166   Last metabolic panel Lab Results  Component Value Date   GLUCOSE 94 08/21/2018   NA 136 08/21/2018   K 4.5 08/21/2018   CL 99 08/21/2018   CO2 22 08/21/2018   BUN 15 08/21/2018   CREATININE 1.21 08/21/2018   GFRNONAA 66 08/21/2018   GFRAA 76 08/21/2018   CALCIUM 9.6 08/21/2018   PROT 7.2 08/21/2018   ALBUMIN 4.8 08/21/2018   LABGLOB 2.4 08/21/2018   AGRATIO 2.0 08/21/2018   BILITOT 0.6 08/21/2018   ALKPHOS 56 08/21/2018   AST 31 08/21/2018   ALT 34 08/21/2018   Last lipids Lab Results  Component Value Date   CHOL 210 (H) 08/21/2018   HDL 77 08/21/2018   LDLCALC 118 (H) 08/21/2018   TRIG 76 08/21/2018   CHOLHDL 2.9 01/09/2018   Last hemoglobin A1c No results found for: HGBA1C Last thyroid functions Lab Results  Component Value Date   TSH 1.300 08/21/2018    Objective    BP 128/78 (BP Location: Left Arm, Patient Position: Sitting, Cuff Size: Large)    Pulse 73    Temp (!) 96.8 F (36 C) (Temporal)    Ht 6' (1.829 m)    Wt 204 lb (92.5 kg)    SpO2 97%    BMI 27.67 kg/m  BP Readings from Last 3 Encounters:  09/05/19 128/78  08/21/18 100/60  02/05/18 118/82   Wt Readings from Last 3 Encounters:  09/05/19 204 lb (92.5 kg)  08/21/18 209 lb (94.8 kg)  02/05/18 208 lb 9.6 oz (94.6 kg)   Physical Exam Constitutional:      Appearance: He is well-developed.  HENT:     Head: Normocephalic and atraumatic.     Right Ear: External ear normal.     Left Ear: External ear normal.     Nose: Nose normal.  Eyes:     General:        Right eye: No discharge.     Conjunctiva/sclera: Conjunctivae  normal.     Pupils: Pupils are equal, round, and reactive to light.  Neck:     Thyroid: No thyromegaly.     Trachea: No tracheal deviation.  Cardiovascular:  Rate and Rhythm: Normal rate and regular rhythm.     Heart sounds: Normal heart sounds. No murmur heard.   Pulmonary:     Effort: Pulmonary effort is normal. No respiratory distress.     Breath sounds: Normal breath sounds. No wheezing or rales.  Chest:     Chest wall: No tenderness.  Abdominal:     General: There is no distension.     Palpations: Abdomen is soft. There is no mass.     Tenderness: There is no abdominal tenderness. There is no guarding or rebound.  Genitourinary:    Penis: Normal.      Testes: Normal.     Prostate: Normal.     Rectum: Normal.  Musculoskeletal:        General: No tenderness. Normal range of motion.     Cervical back: Normal range of motion and neck supple.  Lymphadenopathy:     Cervical: No cervical adenopathy.  Skin:    General: Skin is warm and dry.     Findings: No erythema or rash.  Neurological:     Mental Status: He is alert and oriented to person, place, and time.     Cranial Nerves: No cranial nerve deficit.     Motor: No abnormal muscle tone.     Coordination: Coordination normal.     Deep Tendon Reflexes: Reflexes are normal and symmetric. Reflexes normal.  Psychiatric:        Behavior: Behavior normal.        Thought Content: Thought content normal.        Judgment: Judgment normal.     Comments: Dyslexia.     Depression Screen  PHQ 2/9 Scores 09/05/2019 08/21/2018 02/05/2018  PHQ - 2 Score 0 0 0  PHQ- 9 Score 0 - -    No results found for any visits on 09/05/19.  Assessment & Plan    Routine Health Maintenance and Physical Exam  Exercise Activities and Dietary recommendations Goals   No exercise programs but job is very physical. Continues low fat diet.     Immunization History  Administered Date(s) Administered   Influenza Split 01/18/2010    Influenza,inj,Quad PF,6+ Mos 01/03/2013, 12/16/2013, 05/19/2016, 01/09/2018, 02/08/2019   PFIZER SARS-COV-2 Vaccination 07/01/2019, 07/24/2019   Tdap 05/19/2016    Health Maintenance  Topic Date Due   INFLUENZA VACCINE  10/20/2019   COLONOSCOPY  07/19/2025   TETANUS/TDAP  05/20/2026   COVID-19 Vaccine  Completed   Hepatitis C Screening  Completed   HIV Screening  Completed    Discussed health benefits of physical activity, and encouraged him to engage in regular exercise appropriate for his age and condition.  1. Annual physical exam Good general health. Immunizations and screening colonoscopy up to date. Given anticipatory counseling. Will check routine labs. - CBC with Differential/Platelet - Comprehensive metabolic panel - Lipid panel - TSH  2. Essential (primary) hypertension Tolerating enalapril 5 mg qd without side effects. Recheck routine labs. Denies edema, palpitations or dyspnea. - CBC with Differential/Platelet - Comprehensive metabolic panel - Lipid panel - TSH  3. Mixed hyperlipidemia Still taking Pravastatin 20 mg qd and trying to follow a low fat diet. Recheck follow up labs and encouraged to exercise 3-4 days a week for 30-40 minutes. - Comprehensive metabolic panel - Lipid panel - TSH  4. Dyslexia Difficulty with reading or writing due to dyslexia.  5. History of gout No recent attacks. No longer taking Allopurinol. Given low purine diet restrictions. Recheck labs. - CBC  with Differential/Platelet - Comprehensive metabolic panel - Uric acid  6. Prostate cancer screening - PSA   No follow-ups on file.     Andres Shad, PA, have reviewed all documentation for this visit. The documentation on 09/05/19 for the exam, diagnosis, procedures, and orders are all accurate and complete.    Vernie Murders, [place] [phone] (phone) [phone] (fax)  [place]

## 2019-09-05 ENCOUNTER — Encounter: Payer: Self-pay | Admitting: Family Medicine

## 2019-09-05 ENCOUNTER — Ambulatory Visit (INDEPENDENT_AMBULATORY_CARE_PROVIDER_SITE_OTHER): Payer: Medicare Other | Admitting: Family Medicine

## 2019-09-05 ENCOUNTER — Other Ambulatory Visit: Payer: Self-pay

## 2019-09-05 ENCOUNTER — Encounter: Payer: Medicare Other | Admitting: Family Medicine

## 2019-09-05 VITALS — BP 128/78 | HR 73 | Temp 96.8°F | Ht 72.0 in | Wt 204.0 lb

## 2019-09-05 DIAGNOSIS — Z125 Encounter for screening for malignant neoplasm of prostate: Secondary | ICD-10-CM

## 2019-09-05 DIAGNOSIS — I10 Essential (primary) hypertension: Secondary | ICD-10-CM

## 2019-09-05 DIAGNOSIS — E782 Mixed hyperlipidemia: Secondary | ICD-10-CM

## 2019-09-05 DIAGNOSIS — Z0000 Encounter for general adult medical examination without abnormal findings: Secondary | ICD-10-CM

## 2019-09-05 DIAGNOSIS — Z8739 Personal history of other diseases of the musculoskeletal system and connective tissue: Secondary | ICD-10-CM

## 2019-09-05 DIAGNOSIS — R480 Dyslexia and alexia: Secondary | ICD-10-CM

## 2019-09-05 NOTE — Patient Instructions (Signed)

## 2019-09-06 ENCOUNTER — Telehealth: Payer: Self-pay

## 2019-09-06 DIAGNOSIS — E782 Mixed hyperlipidemia: Secondary | ICD-10-CM

## 2019-09-06 DIAGNOSIS — Z8739 Personal history of other diseases of the musculoskeletal system and connective tissue: Secondary | ICD-10-CM

## 2019-09-06 LAB — LIPID PANEL
Chol/HDL Ratio: 3.8 ratio (ref 0.0–5.0)
Cholesterol, Total: 253 mg/dL — ABNORMAL HIGH (ref 100–199)
HDL: 67 mg/dL (ref >39)
LDL Chol Calc (NIH): 158 mg/dL — ABNORMAL HIGH (ref 0–99)
Triglycerides: 157 mg/dL — ABNORMAL HIGH (ref 0–149)
VLDL Cholesterol Cal: 28 mg/dL (ref 5–40)

## 2019-09-06 LAB — TSH: TSH: 0.909 u[IU]/mL (ref 0.450–4.500)

## 2019-09-06 LAB — CBC WITH DIFFERENTIAL/PLATELET
Basophils Absolute: 0 10*3/uL (ref 0.0–0.2)
Basos: 1 %
EOS (ABSOLUTE): 0.1 10*3/uL (ref 0.0–0.4)
Eos: 2 %
Hematocrit: 42.1 % (ref 37.5–51.0)
Hemoglobin: 14.1 g/dL (ref 13.0–17.7)
Immature Grans (Abs): 0 10*3/uL (ref 0.0–0.1)
Immature Granulocytes: 0 %
Lymphocytes Absolute: 1.7 10*3/uL (ref 0.7–3.1)
Lymphs: 34 %
MCH: 29.3 pg (ref 26.6–33.0)
MCHC: 33.5 g/dL (ref 31.5–35.7)
MCV: 87 fL (ref 79–97)
Monocytes Absolute: 0.4 10*3/uL (ref 0.1–0.9)
Monocytes: 9 %
Neutrophils Absolute: 2.6 10*3/uL (ref 1.4–7.0)
Neutrophils: 54 %
Platelets: 274 10*3/uL (ref 150–450)
RBC: 4.82 x10E6/uL (ref 4.14–5.80)
RDW: 12.9 % (ref 11.6–15.4)
WBC: 4.9 10*3/uL (ref 3.4–10.8)

## 2019-09-06 LAB — COMPREHENSIVE METABOLIC PANEL
ALT: 35 IU/L (ref 0–44)
AST: 29 IU/L (ref 0–40)
Albumin/Globulin Ratio: 1.8 (ref 1.2–2.2)
Albumin: 4.7 g/dL (ref 3.8–4.9)
Alkaline Phosphatase: 57 IU/L (ref 48–121)
BUN/Creatinine Ratio: 12 (ref 9–20)
BUN: 14 mg/dL (ref 6–24)
Bilirubin Total: 0.3 mg/dL (ref 0.0–1.2)
CO2: 23 mmol/L (ref 20–29)
Calcium: 9.4 mg/dL (ref 8.7–10.2)
Chloride: 101 mmol/L (ref 96–106)
Creatinine, Ser: 1.21 mg/dL (ref 0.76–1.27)
GFR calc Af Amer: 75 mL/min/{1.73_m2} (ref >59)
GFR calc non Af Amer: 65 mL/min/{1.73_m2} (ref >59)
Globulin, Total: 2.6 g/dL (ref 1.5–4.5)
Glucose: 79 mg/dL (ref 65–99)
Potassium: 4.5 mmol/L (ref 3.5–5.2)
Sodium: 137 mmol/L (ref 134–144)
Total Protein: 7.3 g/dL (ref 6.0–8.5)

## 2019-09-06 LAB — PSA: Prostate Specific Ag, Serum: 0.6 ng/mL (ref 0.0–4.0)

## 2019-09-06 LAB — URIC ACID: Uric Acid: 8.9 mg/dL — ABNORMAL HIGH (ref 3.8–8.4)

## 2019-09-06 MED ORDER — PRAVASTATIN SODIUM 40 MG PO TABS: 40.0000 mg | ORAL_TABLET | Freq: Every day | ORAL | 3 refills | Status: DC

## 2019-09-06 MED ORDER — ALLOPURINOL 100 MG PO TABS: 100.0000 mg | ORAL_TABLET | Freq: Every day | ORAL | 3 refills | Status: DC

## 2019-09-06 NOTE — Telephone Encounter (Signed)
-----   Message from Margo Common, [place] sent at 09/06/2019  8:19 AM EDT ----- All tests normal except total and LDL cholesterol is high and need to increase Pravastatin to 40 mg qd #90 & 3 RF. Uric acid is above normal range and should be on Allopurinol 100 mg qd #90 & 3 RF to prevent gout attacks. Recheck levels in 3 months.

## 2019-09-06 NOTE — Telephone Encounter (Signed)
Patient advised and expressed verbal understanding. Medication will be sent to patient's pharmacy.

## 2019-09-10 ENCOUNTER — Other Ambulatory Visit (INDEPENDENT_AMBULATORY_CARE_PROVIDER_SITE_OTHER): Payer: Medicare Other | Admitting: Family Medicine

## 2019-09-10 DIAGNOSIS — Z0000 Encounter for general adult medical examination without abnormal findings: Secondary | ICD-10-CM

## 2019-09-12 ENCOUNTER — Telehealth: Payer: Self-pay | Admitting: Family Medicine

## 2019-09-12 ENCOUNTER — Telehealth: Payer: Self-pay

## 2019-09-12 LAB — IFOBT (OCCULT BLOOD): IFOBT: NEGATIVE

## 2019-09-12 NOTE — Telephone Encounter (Signed)
Attempted to contact patient with no answer, if patient returns call okay for [facility] triage to advise. KW

## 2019-09-12 NOTE — Telephone Encounter (Signed)
Patient called, left VM to return the call to the office. 

## 2019-09-12 NOTE — Telephone Encounter (Signed)
Result Notes  Margo Common, PA  09/06/2019 8:19 AM EDT Back to Top    All tests normal except total and LDL cholesterol is high and need to increase Pravastatin to 40 mg qd #90 & 3 RF. Uric acid is above normal range and should be on Allopurinol 100 mg qd #90 & 3 RF to prevent gout attacks. Recheck levels in 3 months.

## 2019-09-12 NOTE — Telephone Encounter (Signed)
lmtcb okay for PEC to advise.KW

## 2019-09-12 NOTE — Telephone Encounter (Signed)
Copied from [place] [id number]. Topic: Medicare AWV >> Sep 12, 2019 10:14 AM Cher Nakai R wrote: Reason for CRM:  Left message for patient to call back and schedule Medicare Annual Wellness Visit (AWV) either virtually or in office.  Last AWV: 06/12/2017  Please schedule at anytime with [facility] Health Advisor.

## 2019-09-12 NOTE — Telephone Encounter (Signed)
Pt has been scheduled.  °

## 2019-09-12 NOTE — Telephone Encounter (Signed)
Copied from [place] [id number]. Topic: Quick Communication - Lab Results (Clinic Use ONLY) >> Sep 12, 2019 10:22 AM Scherrie Gerlach wrote: Pt would like to hear back about his lab results

## 2019-09-12 NOTE — Addendum Note (Signed)
Addended by: Minette Headland on: 09/12/2019 09:42 AM   Modules accepted: Orders

## 2019-09-12 NOTE — Telephone Encounter (Signed)
-----   Message from Margo Common, [place] sent at 09/12/2019 10:03 AM EDT ----- Stool test negative for blood. Follow up in 3 months as planned.

## 2019-09-13 NOTE — Telephone Encounter (Signed)
Pt given lab results per notes of Barbette Hair on 09/06/19. Pt verbalized understanding. Patient has already picked up prescriptions. Patient would like a reminder call when it closer to the 3 month period to return for recheck of labs.

## 2019-09-13 NOTE — Telephone Encounter (Signed)
Pt given lab results per notes of Barbette Hair on 09/12/19. Pt verbalized understanding.

## 2019-09-25 NOTE — Progress Notes (Addendum)
Subjective:   Anthony Hood is a 59 y.o. male who presents for Medicare Annual/Subsequent preventive examination.  I connected with Hall Busing today by telephone and verified that I am speaking with the correct person using two identifiers. Location patient: home Location provider: work Persons participating in the virtual visit: patient, provider.   I discussed the limitations, risks, security and privacy concerns of performing an evaluation and management service by telephone and the availability of in person appointments. I also discussed with the patient that there may be a patient responsible charge related to this service. The patient expressed understanding and verbally consented to this telephonic visit.    Interactive audio and video telecommunications were attempted between this provider and patient, however failed, due to patient having technical difficulties OR patient did not have access to video capability.  We continued and completed visit with audio only.   Review of Systems    N/A  Cardiac Risk Factors include: advanced age (>3men, >64 women);dyslipidemia;male gender     Objective:    There were no vitals filed for this visit. There is no height or weight on file to calculate BMI.  Advanced Directives 09/26/2019 12/16/2017 07/20/2015 04/21/2015  Does Patient Have a Medical Advance Directive? No No No No  Would patient like information on creating a medical advance directive? No - Patient declined No - Patient declined No - patient declined information Yes - Educational materials given    Current Medications (verified) Outpatient Encounter Medications as of 09/26/2019  Medication Sig   allopurinol (ZYLOPRIM) 100 MG tablet Take 1 tablet (100 mg total) by mouth daily.   enalapril (VASOTEC) 5 MG tablet Take 1 tablet (5 mg total) by mouth daily.   pravastatin (PRAVACHOL) 40 MG tablet Take 1 tablet (40 mg total) by mouth daily.   No facility-administered encounter  medications on file as of 09/26/2019.    Allergies (verified) Patient has no known allergies.   History: Past Medical History:  Diagnosis Date   Hyperlipidemia    Hypertension    Past Surgical History:  Procedure Laterality Date   COLONOSCOPY WITH PROPOFOL N/A 07/20/2015   Procedure: COLONOSCOPY WITH PROPOFOL;  Surgeon: Manya Silvas, MD;  Location: [facility] ENDOSCOPY;  Service: Endoscopy;  Laterality: N/A;   HERNIA REPAIR     umbilical   SHOULDER SURGERY     Family History  Problem Relation Age of Onset   COPD Mother    Heart disease Father    Hypertension Sister    Seizures Sister    Social History   Socioeconomic History   Marital status: Married    Spouse name: Not on file   Number of children: 0   Years of education: Not on file   Highest education level: High school graduate  Occupational History   Occupation: disability  Tobacco Use   Smoking status: Never Smoker   Smokeless tobacco: Never Used  Scientific laboratory technician Use: Never used  Substance and Sexual Activity   Alcohol use: Yes    Alcohol/week: 1.0 - 2.0 standard drink    Types: 1 - 2 Cans of beer per week   Drug use: No   Sexual activity: Not on file  Other Topics Concern   Not on file  Social History Narrative   2 stepchildren   Social Determinants of Health   Financial Resource Strain: Low Risk    Difficulty of Paying Living Expenses: Not very hard  Food Insecurity: No Food Insecurity   Worried About  Running Out of Food in the Last Year: Never true   Ran Out of Food in the Last Year: Never true  Transportation Needs: No Transportation Needs   Lack of Transportation (Medical): No   Lack of Transportation (Non-Medical): No  Physical Activity: Inactive   Days of Exercise per Week: 0 days   Minutes of Exercise per Session: 0 min  Stress: No Stress Concern Present   Feeling of Stress : Not at all  Social Connections: Moderately Integrated   Frequency of Communication with Friends and Family: More  than three times a week   Frequency of Social Gatherings with Friends and Family: More than three times a week   Attends Religious Services: More than 4 times per year   Active Member of [employer] or Organizations: No   Attends Music therapist: Never   Marital Status: Married    Tobacco Counseling Counseling given: Not Answered   Clinical Intake:  Pre-visit preparation completed: Yes  Pain : No/denies pain     Nutritional Risks: None Diabetes: No  How often do you need to have someone help you when you read instructions, pamphlets, or other written materials from your doctor or pharmacy?: 1 - Never  Diabetic? No  Interpreter Needed?: No  Information entered by :: [employer], LPN   Activities of Daily Living In your present state of health, do you have any difficulty performing the following activities: 09/26/2019 09/05/2019  Hearing? N N  Vision? N N  Comment Wears eye glasses. -  Difficulty concentrating or making decisions? N N  Walking or climbing stairs? N N  Dressing or bathing? N N  Doing errands, shopping? N N  Preparing Food and eating ? N -  Using the Toilet? N -  In the past six months, have you accidently leaked urine? N -  Do you have problems with loss of bowel control? N -  Managing your Medications? N -  Managing your Finances? N -  Housekeeping or managing your Housekeeping? N -  Some recent data might be hidden    Patient Care Team: Chrismon, Vickki Muff, PA as PCP - General (Family Medicine) Odette Fraction (Optometry) Nat Christen, MD as Attending Physician (Optometry)  Indicate any recent Medical Services you may have received from other than [facility] providers in the past year (date may be approximate).     Assessment:   This is a routine wellness examination for Anthony Hood.  Hearing/Vision screen No exam data present  Dietary issues and exercise activities discussed: Current Exercise Habits: The patient does not participate  in regular exercise at present, Exercise limited by: None identified  Goals      DIET - INCREASE LEAN PROTEINS     Recommend to cut back on red meats and eat more chicken, [place] or fish.        Depression Screen PHQ 2/9 Scores 09/05/2019 08/21/2018 02/05/2018 06/12/2017 05/19/2016 05/18/2015  PHQ - 2 Score 0 0 0 2 0 0  PHQ- 9 Score 0 - - 2 0 -    Fall Risk Fall Risk  09/26/2019 09/05/2019 08/21/2018 02/05/2018 06/12/2017  Falls in the past year? 0 0 0 0 No  Number falls in past yr: 0 0 - - -  Injury with Fall? 0 0 - - -  Follow up - Falls evaluation completed - - -    Any stairs in or around the home? Yes  If so, are there any without handrails? No  Home free of loose  throw rugs in walkways, pet beds, electrical cords, etc? Yes  Adequate lighting in your home to reduce risk of falls? Yes   ASSISTIVE DEVICES UTILIZED TO PREVENT FALLS:  Life alert? No  Use of a cane, walker or w/c? No  Grab bars in the bathroom? No  Shower chair or bench in shower? No  Elevated toilet seat or a handicapped toilet? No    Cognitive Function: Declined today.         Immunizations Immunization History  Administered Date(s) Administered   Influenza Split 01/18/2010   Influenza,inj,Quad PF,6+ Mos 01/03/2013, 12/16/2013, 05/19/2016, 01/09/2018, 02/08/2019   PFIZER SARS-COV-2 Vaccination 07/01/2019, 07/24/2019   Tdap 05/19/2016    TDAP status: Up to date Flu Vaccine status: Up to date Covid-19 vaccine status: Completed vaccines  Qualifies for Shingles Vaccine? Yes   Zostavax completed No   Shingrix Completed?: No.    Education has been provided regarding the importance of this vaccine. Patient has been advised to call insurance company to determine out of pocket expense if they have not yet received this vaccine. Advised may also receive vaccine at local pharmacy or Health Dept. Verbalized acceptance and understanding.  Screening Tests Health Maintenance  Topic Date Due   INFLUENZA VACCINE   10/20/2019   COLONOSCOPY  07/19/2025   TETANUS/TDAP  05/20/2026   COVID-19 Vaccine  Completed   Hepatitis C Screening  Completed   HIV Screening  Completed    Health Maintenance  There are no preventive care reminders to display for this patient.  Colorectal cancer screening: Completed 07/20/15. Repeat every 10 years  Lung Cancer Screening: (Low Dose CT Chest recommended if Age 52-80 years, 30 pack-year currently smoking OR have quit w/in 15years.) does not qualify.   Additional Screening:  Hepatitis C Screening: Up to date  Vision Screening: Recommended annual ophthalmology exams for early detection of glaucoma and other disorders of the eye. Is the patient up to date with their annual eye exam?  Yes  Who is the provider or what is the name of the office in which the patient attends annual eye exams? Dr. Gwynn Burly @ [facility] If pt is not established with a provider, would they like to be referred to a provider to establish care? No .   Dental Screening: Recommended annual dental exams for proper oral hygiene  Community Resource Referral / Chronic Care Management: CRR required this visit?  No   CCM required this visit?  No      Plan:     I have personally reviewed and noted the following in the patient's chart:   Medical and social history Use of alcohol, tobacco or illicit drugs  Current medications and supplements Functional ability and status Nutritional status Physical activity Advanced directives List of other physicians Hospitalizations, surgeries, and ER visits in previous 12 months Vitals Screenings to include cognitive, depression, and falls Referrals and appointments  In addition, I have reviewed and discussed with patient certain preventive protocols, quality metrics, and best practice recommendations. A written personalized care plan for preventive services as well as general preventive health recommendations were provided to patient.      Noheli Melder  [place], [place]   10/19/[id number]   Nurse Notes: None.  Reviewed notes and recommendations from [place] screening. Was available for consultation during interview. Agree with documentation and recommendations.

## 2019-09-26 ENCOUNTER — Ambulatory Visit (INDEPENDENT_AMBULATORY_CARE_PROVIDER_SITE_OTHER): Payer: Medicare Other

## 2019-09-26 ENCOUNTER — Other Ambulatory Visit: Payer: Self-pay

## 2019-09-26 DIAGNOSIS — Z0000 Encounter for general adult medical examination without abnormal findings: Secondary | ICD-10-CM | POA: Diagnosis not present

## 2019-09-26 NOTE — Patient Instructions (Signed)
Anthony Hood , Thank you for taking time to come for your Medicare Wellness Visit. I appreciate your ongoing commitment to your health goals. Please review the following plan we discussed and let me know if I can assist you in the future.   Screening recommendations/referrals: Colonoscopy: Up to date, due 07/2025 Recommended yearly ophthalmology/optometry visit for glaucoma screening and checkup Recommended yearly dental visit for hygiene and checkup  Vaccinations: Influenza vaccine: Done 02/08/19 Tdap vaccine: Up to date, due 05/2026  Advanced directives: Advance directive discussed with you today. Even though you declined this today please call our office should you change your mind and we can give you the proper paperwork for you to fill out.  Conditions/risks identified: Recommend to cut back on red meats and eat more chicken, [place] or fish.   Next appointment: 10/07/20 @ 2:00 PM for an AWV. Declined scheduling a follow up with PCP at this time.   Preventive Care 40-64 Years, Male Preventive care refers to lifestyle choices and visits with your health care provider that can promote health and wellness. What does preventive care include?  A yearly physical exam. This is also called an annual well check.  Dental exams once or twice a year.  Routine eye exams. Ask your health care provider how often you should have your eyes checked.  Personal lifestyle choices, including:  Daily care of your teeth and gums.  Regular physical activity.  Eating a healthy diet.  Avoiding tobacco and drug use.  Limiting alcohol use.  Practicing safe sex.  Taking low-dose aspirin every day starting at age 50. What happens during an annual well check? The services and screenings done by your health care provider during your annual well check will depend on your age, overall health, lifestyle risk factors, and family history of disease. Counseling  Your health care provider may ask you questions  about your:  Alcohol use.  Tobacco use.  Drug use.  Emotional well-being.  Home and relationship well-being.  Sexual activity.  Eating habits.  Work and work Statistician. Screening  You may have the following tests or measurements:  Height, weight, and BMI.  Blood pressure.  Lipid and cholesterol levels. These may be checked every 5 years, or more frequently if you are over 64 years old.  Skin check.  Lung cancer screening. You may have this screening every year starting at age 54 if you have a 30-pack-year history of smoking and currently smoke or have quit within the past 15 years.  Fecal occult blood test (FOBT) of the stool. You may have this test every year starting at age 74.  Flexible sigmoidoscopy or colonoscopy. You may have a sigmoidoscopy every 5 years or a colonoscopy every 10 years starting at age 63.  Prostate cancer screening. Recommendations will vary depending on your family history and other risks.  Hepatitis C blood test.  Hepatitis B blood test.  Sexually transmitted disease (STD) testing.  Diabetes screening. This is done by checking your blood sugar (glucose) after you have not eaten for a while (fasting). You may have this done every 1-3 years. Discuss your test results, treatment options, and if necessary, the need for more tests with your health care provider. Vaccines  Your health care provider may recommend certain vaccines, such as:  Influenza vaccine. This is recommended every year.  Tetanus, diphtheria, and acellular pertussis (Tdap, Td) vaccine. You may need a Td booster every 10 years.  Zoster vaccine. You may need this after age 81.  Pneumococcal  13-valent conjugate (PCV13) vaccine. You may need this if you have certain conditions and have not been vaccinated.  Pneumococcal polysaccharide (PPSV23) vaccine. You may need one or two doses if you smoke cigarettes or if you have certain conditions. Talk to your health care provider  about which screenings and vaccines you need and how often you need them. This information is not intended to replace advice given to you by your health care provider. Make sure you discuss any questions you have with your health care provider. Document Released: 04/03/2015 Document Revised: 11/25/2015 Document Reviewed: 01/06/2015 Elsevier Interactive Patient Education  2017 [place] Prevention in the Home Falls can cause injuries. They can happen to people of all ages. There are many things you can do to make your home safe and to help prevent falls. What can I do on the outside of my home?  Regularly fix the edges of walkways and driveways and fix any cracks.  Remove anything that might make you trip as you walk through a door, such as a raised step or threshold.  Trim any bushes or trees on the path to your home.  Use bright outdoor lighting.  Clear any walking paths of anything that might make someone trip, such as rocks or tools.  Regularly check to see if handrails are loose or broken. Make sure that both sides of any steps have handrails.  Any raised decks and porches should have guardrails on the edges.  Have any leaves, snow, or ice cleared regularly.  Use sand or salt on walking paths during winter.  Clean up any spills in your garage right away. This includes oil or grease spills. What can I do in the bathroom?  Use night lights.  Install grab bars by the toilet and in the tub and shower. Do not use towel bars as grab bars.  Use non-skid mats or decals in the tub or shower.  If you need to sit down in the shower, use a plastic, non-slip stool.  Keep the floor dry. Clean up any water that spills on the floor as soon as it happens.  Remove soap buildup in the tub or shower regularly.  Attach bath mats securely with double-sided non-slip rug tape.  Do not have throw rugs and other things on the floor that can make you trip. What can I do in the  bedroom?  Use night lights.  Make sure that you have a light by your bed that is easy to reach.  Do not use any sheets or blankets that are too big for your bed. They should not hang down onto the floor.  Have a firm chair that has side arms. You can use this for support while you get dressed.  Do not have throw rugs and other things on the floor that can make you trip. What can I do in the kitchen?  Clean up any spills right away.  Avoid walking on wet floors.  Keep items that you use a lot in easy-to-reach places.  If you need to reach something above you, use a strong step stool that has a grab bar.  Keep electrical cords out of the way.  Do not use floor polish or wax that makes floors slippery. If you must use wax, use non-skid floor wax.  Do not have throw rugs and other things on the floor that can make you trip. What can I do with my stairs?  Do not leave any items on the stairs.  Make sure that there are handrails on both sides of the stairs and use them. Fix handrails that are broken or loose. Make sure that handrails are as long as the stairways.  Check any carpeting to make sure that it is firmly attached to the stairs. Fix any carpet that is loose or worn.  Avoid having throw rugs at the top or bottom of the stairs. If you do have throw rugs, attach them to the floor with carpet tape.  Make sure that you have a light switch at the top of the stairs and the bottom of the stairs. If you do not have them, ask someone to add them for you. What else can I do to help prevent falls?  Wear shoes that:  Do not have high heels.  Have rubber bottoms.  Are comfortable and fit you well.  Are closed at the toe. Do not wear sandals.  If you use a stepladder:  Make sure that it is fully opened. Do not climb a closed stepladder.  Make sure that both sides of the stepladder are locked into place.  Ask someone to hold it for you, if possible.  Clearly mark and make  sure that you can see:  Any grab bars or handrails.  First and last steps.  Where the edge of each step is.  Use tools that help you move around (mobility aids) if they are needed. These include:  Canes.  Walkers.  Scooters.  Crutches.  Turn on the lights when you go into a dark area. Replace any light bulbs as soon as they burn out.  Set up your furniture so you have a clear path. Avoid moving your furniture around.  If any of your floors are uneven, fix them.  If there are any pets around you, be aware of where they are.  Review your medicines with your doctor. Some medicines can make you feel dizzy. This can increase your chance of falling. Ask your doctor what other things that you can do to help prevent falls. This information is not intended to replace advice given to you by your health care provider. Make sure you discuss any questions you have with your health care provider. Document Released: 01/01/2009 Document Revised: 08/13/2015 Document Reviewed: 04/11/2014 Elsevier Interactive Patient Education  2017 [employer].

## 2020-01-09 ENCOUNTER — Telehealth: Payer: Self-pay | Admitting: Family Medicine

## 2020-01-09 DIAGNOSIS — Z8739 Personal history of other diseases of the musculoskeletal system and connective tissue: Secondary | ICD-10-CM

## 2020-01-09 MED ORDER — ALLOPURINOL 100 MG PO TABS: 100.0000 mg | ORAL_TABLET | Freq: Every day | ORAL | 3 refills | Status: DC

## 2020-01-09 NOTE — Telephone Encounter (Signed)
[facility] faxed refill request for the following medications: [phone] -phone [phone] - fax  allopurinol  tablet   Please advise. Thanks, [employer]

## 2020-02-05 DIAGNOSIS — M19072 Primary osteoarthritis, left ankle and foot: Secondary | ICD-10-CM | POA: Diagnosis not present

## 2020-03-11 ENCOUNTER — Ambulatory Visit: Payer: Medicare Other

## 2020-03-25 ENCOUNTER — Ambulatory Visit (INDEPENDENT_AMBULATORY_CARE_PROVIDER_SITE_OTHER): Payer: Medicare Other

## 2020-03-25 ENCOUNTER — Other Ambulatory Visit: Payer: Self-pay

## 2020-03-25 DIAGNOSIS — Z23 Encounter for immunization: Secondary | ICD-10-CM | POA: Diagnosis not present

## 2020-09-14 ENCOUNTER — Telehealth: Payer: Self-pay

## 2020-09-14 NOTE — Telephone Encounter (Signed)
Called pt to see if pt wanted to see someone at [facility] because we didn't have any openings this week. Pt asked when he would be able to see Simona Huh. I advised that Simona Huh would be out of the office for a week and his next available would be 09/28/20. Pt stated that is ridiculous and hung up.

## 2020-09-14 NOTE — Telephone Encounter (Signed)
Copied from [place] [phone]. Topic: Appointment Scheduling - Scheduling Inquiry for Clinic >> Sep 14, 2020  8:22 AM Rayann Heman wrote: Reason for CRM: Pt called with right arm pain and would like to be worked in. Please advise.  [phone] >> Sep 14, 2020 10:20 AM Tessa Lerner A wrote: Patient has made an additional call regarding this matter  Please contact to further advise when possible

## 2020-09-29 ENCOUNTER — Other Ambulatory Visit: Payer: Self-pay

## 2020-09-29 ENCOUNTER — Emergency Department
Admission: EM | Admit: 2020-09-29 | Discharge: 2020-09-29 | Disposition: A | Discharge status: Home / Self Care | Payer: Medicare Other | Attending: Emergency Medicine | Admitting: Emergency Medicine

## 2020-09-29 DIAGNOSIS — R202 Paresthesia of skin: Secondary | ICD-10-CM | POA: Diagnosis present

## 2020-09-29 DIAGNOSIS — G5621 Lesion of ulnar nerve, right upper limb: Secondary | ICD-10-CM | POA: Insufficient documentation

## 2020-09-29 DIAGNOSIS — I10 Essential (primary) hypertension: Secondary | ICD-10-CM | POA: Insufficient documentation

## 2020-09-29 DIAGNOSIS — Z79899 Other long term (current) drug therapy: Secondary | ICD-10-CM | POA: Insufficient documentation

## 2020-09-29 MED ORDER — NAPROXEN 500 MG PO TABS: 500.0000 mg | ORAL_TABLET | Freq: Two times a day (BID) | ORAL | 2 refills | Status: DC

## 2020-09-29 MED ORDER — PREDNISONE 50 MG PO TABS: 50.0000 mg | ORAL_TABLET | Freq: Every day | ORAL | 0 refills | Status: DC

## 2020-09-29 NOTE — ED Triage Notes (Signed)
Pt in with co right upper arm pain that radiates down to right elbow for over a week. No known injury, nos co right hand tingling.

## 2020-09-29 NOTE — ED Provider Notes (Signed)
[facility] Emergency Department Provider Note   ____________________________________________    I have reviewed the triage vital signs and the nursing notes.   HISTORY  Chief Complaint Arm Pain and Tingling     HPI Anthony Hood is a 60 y.o. male who presents with complaints of intermittent tingling to the right arm.  He describes it certain positions he has tingling in his fourth and fifth fingers and along the medial aspect of his right arm distal to the elbow.  No neck pain.  No headache, no other neurodeficits.  No muscle weakness.  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  No injury to his elbow.  No rash  Past Medical History:  Diagnosis Date   Hyperlipidemia    Hypertension     Patient Active Problem List   Diagnosis Date Noted   Gout involving toe of left foot 08/21/2018   Dyslexia 05/19/2016   Essential (primary) hypertension 04/24/2015   Fam hx-ischem heart disease 10/21/2008   HLD (hyperlipidemia) 01/11/2007    Past Surgical History:  Procedure Laterality Date   COLONOSCOPY WITH PROPOFOL N/A 07/20/2015   Procedure: COLONOSCOPY WITH PROPOFOL;  Surgeon: Manya Silvas, MD;  Location: [facility] [place];  Service: Endoscopy;  Laterality: N/A;   HERNIA REPAIR     umbilical   SHOULDER SURGERY      Prior to Admission medications   Medication Sig Start Date End Date Taking? Authorizing Provider  naproxen (NAPROSYN) 500 MG tablet Take 1 tablet (500 mg total) by mouth 2 (two) times daily with a meal. 09/29/20  Yes Lavonia Drafts, MD  predniSONE (DELTASONE) 50 MG tablet Take 1 tablet (50 mg total) by mouth daily with breakfast. 09/29/20  Yes Lavonia Drafts, MD  allopurinol (ZYLOPRIM) 100 MG tablet Take 1 tablet (100 mg total) by mouth daily. 01/09/20   Chrismon, Simona Huh E, PA-C  enalapril (VASOTEC) 5 MG tablet Take 1 tablet (5 mg total) by mouth daily. 06/06/19   Chrismon, Vickki Muff, PA-C   pravastatin (PRAVACHOL) 40 MG tablet Take 1 tablet (40 mg total) by mouth daily. 09/06/19   Chrismon, Vickki Muff, PA-C     Allergies Patient has no known allergies.  Family History  Problem Relation Age of Onset   COPD Mother    Heart disease Father    Hypertension Sister    Seizures Sister     Social History Social History   Tobacco Use   Smoking status: Never   Smokeless tobacco: Never  Vaping Use   Vaping Use: Never used  Substance Use Topics   Alcohol use: Yes    Alcohol/week: 1.0 - 2.0 standard drink    Types: 1 - 2 Cans of beer per week   Drug use: No    Review of Systems  Constitutional: No fever/chills     Gastrointestinal: No abdominal pain.     Musculoskeletal: As above Skin: Negative for rash. Neurological: Negative for headaches, no weakness    ____________________________________________   PHYSICAL EXAM:  VITAL SIGNS: ED Triage Vitals  Enc Vitals Group     BP 09/29/20 0628 (!) 152/92     Pulse Rate 09/29/20 0628 70     Resp 09/29/20 0628 18     Temp 09/29/20 0628 97.9 F (36.6 C)     Temp Source 09/29/20 0628 Oral     SpO2 09/29/20 0628 97 %     Weight 09/29/20 0632 113.9 kg (251 lb)  Height 09/29/20 0632 1.829 m (6')     Head Circumference --      Peak Flow --      Pain Score 09/29/20 0632 9     Pain Loc --      Pain Edu? --      Excl. in [place]? --      Constitutional: Alert and oriented.  Eyes: Conjunctivae are normal.  Head: Atraumatic. Nose: No congestion/rhinnorhea. Mouth/Throat: Mucous membranes are moist.   Cardiovascular: Normal rate, regular rhythm.  Respiratory: Normal respiratory effort.  No retractions.  Musculoskeletal: Right arm: No swelling of the elbow, normal strength, no rash, no significant tenderness to palpation, normal tendon function. Neurologic:  Normal speech and language. No gross focal neurologic deficits are appreciated.   Skin:  Skin is warm, dry and intact. No rash  noted.   ____________________________________________   LABS (all labs ordered are listed, but only abnormal results are displayed)  Labs Reviewed - No data to display ____________________________________________  EKG   ____________________________________________  RADIOLOGY   ____________________________________________   PROCEDURES  Procedure(s) performed: No  Procedures   Critical Care performed: No ____________________________________________   INITIAL IMPRESSION / ASSESSMENT AND PLAN / ED COURSE  Pertinent labs & imaging results that were available during my care of the patient were reviewed by me and considered in my medical decision making (see chart for details).   Patient symptoms most consistent with cubital tunnel syndrome, will trial steroids/nonsteroidal anti-inflammatories, outpatient follow-up with Ortho, rest, RICE.   ____________________________________________   FINAL CLINICAL IMPRESSION(S) / ED DIAGNOSES  Final diagnoses:  Cubital tunnel syndrome on right      NEW MEDICATIONS STARTED DURING THIS VISIT:  New Prescriptions   NAPROXEN (NAPROSYN) 500 MG TABLET    Take 1 tablet (500 mg total) by mouth 2 (two) times daily with a meal.   PREDNISONE (DELTASONE) 50 MG TABLET    Take 1 tablet (50 mg total) by mouth daily with breakfast.     Note:  This document was prepared using Dragon voice recognition software and may include unintentional dictation errors.    Lavonia Drafts, MD 09/29/20 [phone]

## 2020-10-05 ENCOUNTER — Ambulatory Visit: Payer: Medicare Other | Admitting: Family Medicine

## 2020-10-13 ENCOUNTER — Ambulatory Visit (INDEPENDENT_AMBULATORY_CARE_PROVIDER_SITE_OTHER): Payer: Medicare Other | Admitting: Family Medicine

## 2020-10-13 ENCOUNTER — Other Ambulatory Visit: Payer: Self-pay

## 2020-10-13 ENCOUNTER — Ambulatory Visit
Admission: RE | Admit: 2020-10-13 | Discharge: 2020-10-13 | Disposition: A | Discharge status: Home / Self Care | Payer: Medicare Other | Attending: Family Medicine | Admitting: Family Medicine

## 2020-10-13 ENCOUNTER — Encounter: Payer: Self-pay | Admitting: Family Medicine

## 2020-10-13 ENCOUNTER — Ambulatory Visit
Admission: RE | Admit: 2020-10-13 | Discharge: 2020-10-13 | Disposition: A | Discharge status: Home / Self Care | Payer: Medicare Other | Source: Ambulatory Visit | Attending: Family Medicine | Admitting: Family Medicine

## 2020-10-13 VITALS — BP 131/82 | HR 77 | Temp 97.3°F | Ht 73.0 in | Wt 209.4 lb

## 2020-10-13 DIAGNOSIS — R202 Paresthesia of skin: Secondary | ICD-10-CM | POA: Diagnosis not present

## 2020-10-13 DIAGNOSIS — M25521 Pain in right elbow: Secondary | ICD-10-CM | POA: Insufficient documentation

## 2020-10-13 DIAGNOSIS — M19021 Primary osteoarthritis, right elbow: Secondary | ICD-10-CM | POA: Diagnosis not present

## 2020-10-13 DIAGNOSIS — M778 Other enthesopathies, not elsewhere classified: Secondary | ICD-10-CM | POA: Diagnosis not present

## 2020-10-13 MED ORDER — NAPROXEN 500 MG PO TABS: 500.0000 mg | ORAL_TABLET | Freq: Two times a day (BID) | ORAL | 2 refills | Status: DC

## 2020-10-13 NOTE — Progress Notes (Signed)
Established patient visit   Patient: Anthony Hood   DOB: [birth year]   60 y.o. Male  MRN: [id number] Visit Date: 10/13/2020  Today's healthcare provider: Vernie Murders, PA-C   No chief complaint on file.  Subjective    HPI  Follow up Hospitalization  Patient was admitted to [facility] on 09/29/2020 and discharged on 09/29/2020. He was treated for tingling to the right arm. Treatment for this included prescribed medications. He reports excellent compliance with treatment. He reports this condition was better while taking medication. Course is completed and still has tingling some days and soreness. Mainly felt when bearing weight on his arm (pushing up out of his seat).   ----------------------------------------------------------------------------------------- -   Patient Active Problem List   Diagnosis Date Noted   Gout involving toe of left foot 08/21/2018   Dyslexia 05/19/2016   Essential (primary) hypertension 04/24/2015   Fam hx-ischem heart disease 10/21/2008   HLD (hyperlipidemia) 01/11/2007   Past Medical History:  Diagnosis Date   Hyperlipidemia    Hypertension    Past Surgical History:  Procedure Laterality Date   COLONOSCOPY WITH PROPOFOL N/A 07/20/2015   Procedure: COLONOSCOPY WITH PROPOFOL;  Surgeon: Manya Silvas, MD;  Location: [facility];  Service: Endoscopy;  Laterality: N/A;   HERNIA REPAIR     umbilical         No Known Allergies  Medications: Outpatient Medications Prior to Visit  Medication Sig   allopurinol (ZYLOPRIM) 100 MG tablet Take 1 tablet (100 mg total) by mouth daily.   enalapril (VASOTEC) 5 MG tablet Take 1 tablet (5 mg total) by mouth daily.   naproxen (NAPROSYN) 500 MG tablet Take 1 tablet (500 mg total) by mouth 2 (two) times daily with a meal.   pravastatin (PRAVACHOL) 40 MG tablet Take 1 tablet (40 mg total) by mouth daily.   predniSONE (DELTASONE) 50 MG tablet Take 1 tablet (50 mg total) by mouth daily with breakfast.    No facility-administered medications prior to visit.    Review of Systems  All other systems reviewed and are negative.      Objective    BP 131/82   Pulse 77   Temp (!) 97.3 F (36.3 C) (Oral)   Ht '6\' 1"'$  (1.854 m)   Wt 209 lb 6.4 oz (95 kg)   SpO2 98%   BMI 27.63 kg/m  BP Readings from Last 3 Encounters:  09/29/20 (!) 152/92  09/05/19 128/78  08/21/18 100/60   Wt Readings from Last 3 Encounters:  09/29/20 251 lb (113.9 kg)  09/05/19 204 lb (92.5 kg)  08/21/18 209 lb (94.8 kg)    Physical Exam Constitutional:      General: He is not in acute distress.    Appearance: He is well-developed.  HENT:     Head: Normocephalic and atraumatic.     Right Ear: Hearing normal.     Left Ear: Hearing normal.     Nose: Nose normal.  Eyes:     General: Lids are normal. No scleral icterus.       Right eye: No discharge.        Left eye: No discharge.     Conjunctiva/sclera: Conjunctivae normal.  Cardiovascular:     Rate and Rhythm: Normal rate and regular rhythm.     Heart sounds: Normal heart sounds.  Pulmonary:     Effort: Pulmonary effort is normal. No respiratory distress.     Breath sounds: Normal breath sounds.  Abdominal:  General: Bowel sounds are normal.     Palpations: Abdomen is soft.  Musculoskeletal:        General: Tenderness present. Normal range of motion.     Cervical back: Neck supple.     Comments: Tender to palpate right ulnar nerve at the elbow with tingling radiating to the 4th and 5th fingers.  Skin:    Findings: No lesion or rash.  Neurological:     Mental Status: He is alert and oriented to person, place, and time.  Psychiatric:        Speech: Speech normal.        Behavior: Behavior normal.        Thought Content: Thought content normal.      No results found for any visits on 10/13/20.  Assessment & Plan     1. Right elbow pain Went to the ER on 09-29-20 with complaint of tingling pain in the right elbow and hand over the previous  3-4 weeks. 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 Will check x-ray and continue NSAID. Restrict repetitive motion activities and recheck pending x-ray report. - naproxen (NAPROSYN) 500 MG tablet; Take 1 tablet (500 mg total) by mouth 2 (two) times daily with a meal.  Dispense: 60 tablet; Refill: 2 - DG Elbow Complete Right   No follow-ups on file.      I, Shanterria Franta, PA-C, have reviewed all documentation for this visit. The documentation on 10/13/20 for the exam, diagnosis, procedures, and orders are all accurate and complete.    Vernie Murders, PA-C  [employer] [phone] (phone) [phone] (fax)  [place]

## 2020-10-13 NOTE — Patient Instructions (Addendum)
Preventive Care 40-61 Years Old, Male Preventive care refers to lifestyle choices and visits with your health care provider that can promote health and wellness. This includes: A yearly physical exam. This is also called an annual wellness visit. Regular dental and eye exams. Immunizations. Screening for certain conditions. Healthy lifestyle choices, such as: Eating a healthy diet. Getting regular exercise. Not using drugs or products that contain nicotine and tobacco. Limiting alcohol use. What can I expect for my preventive care visit? Physical exam Your health care provider will check your: Height and weight. These may be used to calculate your BMI (body mass index). BMI is a measurement that tells if you are at a healthy weight. Heart rate and blood pressure. Body temperature. Skin for abnormal spots. Counseling Your health care provider may ask you questions about your: Past medical problems. Family's medical history. Alcohol, tobacco, and drug use. Emotional well-being. Home life and relationship well-being. Sexual activity. Diet, exercise, and sleep habits. Work and work environment. Access to firearms. What immunizations do I need?  Vaccines are usually given at various ages, according to a schedule. Your health care provider will recommend vaccines for you based on your age, medicalhistory, and lifestyle or other factors, such as travel or where you work. What tests do I need? Blood tests Lipid and cholesterol levels. These may be checked every 5 years, or more often if you are over 50 years old. Hepatitis C test. Hepatitis B test. Screening Lung cancer screening. You may have this screening every year starting at age 55 if you have a 30-pack-year history of smoking and currently smoke or have quit within the past 15 years. Prostate cancer screening. Recommendations will vary depending on your family history and other risks. Genital exam to check for testicular cancer  or hernias. Colorectal cancer screening. All adults should have this screening starting at age 50 and continuing until age 75. Your health care provider may recommend screening at age 45 if you are at increased risk. You will have tests every 1-10 years, depending on your results and the type of screening test. Diabetes screening. This is done by checking your blood sugar (glucose) after you have not eaten for a while (fasting). You may have this done every 1-3 years. STD (sexually transmitted disease) testing, if you are at risk. Follow these instructions at home: Eating and drinking  Eat a diet that includes fresh fruits and vegetables, whole grains, lean protein, and low-fat dairy products. Take vitamin and mineral supplements as recommended by your health care provider. Do not drink alcohol if your health care provider tells you not to drink. If you drink alcohol: Limit how much you have to 0-2 drinks a day. Be aware of how much alcohol is in your drink. In the [place] one drink equals one 12 oz bottle of beer (355 mL), one 5 oz glass of wine (148 mL), or one 1 oz glass of hard liquor (44 mL).  Lifestyle Take daily care of your teeth and gums. Brush your teeth every morning and night with fluoride toothpaste. Floss one time each day. Stay active. Exercise for at least 30 minutes 5 or more days each week. Do not use any products that contain nicotine or tobacco, such as cigarettes, e-cigarettes, and chewing tobacco. If you need help quitting, ask your health care provider. Do not use drugs. If you are sexually active, practice safe sex. Use a condom or other form of protection to prevent STIs (sexually transmitted infections). If told by   your health care provider, take low-dose aspirin daily starting at age 63. Find healthy ways to cope with stress, such as: Meditation, yoga, or listening to music. Journaling. Talking to a trusted person. Spending time with friends and  family. SafetyCubital Tunnel Syndrome  Cubital tunnel syndrome is a condition that causes pain and weakness of the forearm and hand. It happens when one of the nerves that runs along the inside of the elbow joint (ulnar nerve) becomes irritated. This condition is usually caused by repeated arm motionsthat are done during sports or work-related activities. What are the causes? This condition may be caused by: Increased pressure on the ulnar nerve at the elbow, arm, or forearm. This can result from: Irritation caused by repeated elbow bending. Poorly healed elbow fractures. Tumors in the elbow. These are usually noncancerous (benign). Scar tissue that develops in the elbow after an injury. Bony growths (spurs) near the ulnar nerve. Stretching of the nerve due to loose elbow ligaments. Trauma to the nerve at the elbow. What increases the risk? The following factors may make you more likely to develop this condition: Doing manual labor that requires frequent bending of the elbow. Playing sports that include repeated or strenuous throwing motions, such as baseball. Playing contact sports, such as football or lacrosse. Not warming up properly before activities. Having diabetes. Having an underactive thyroid (hypothyroidism). What are the signs or symptoms? Symptoms of this condition include: Clumsiness or weakness of the hand. Tenderness of the inner elbow. Aching or soreness of the inner elbow, forearm, or fingers, especially the little finger or the ring finger. Increased pain when forcing the elbow to bend. Reduced control when throwing objects. Tingling, numbness, or a burning feeling inside the forearm or in part of the hand or fingers, especially the little finger or the ring finger. Sharp pains that shoot from the elbow down to the wrist and hand. The inability to grip or pinch hard. How is this diagnosed? This condition is diagnosed based on: Your symptoms and medical history.  Your health care provider will also ask for details about any injury. A physical exam. You may also have tests, including: Electromyogram (EMG). This test measures electrical signals sent by your nerves into the muscles. Nerve conduction study. This test measures how well electrical signals pass through your nerves. Imaging tests, such as X-rays, ultrasound, and MRI. These tests check for possible causes of your condition. How is this treated? This condition may be treated by: Stopping the activities that are causing your symptoms to get worse. Icing and taking medicines to reduce pain and swelling. Wearing a splint to prevent your elbow from bending, or wearing an elbow pad where the ulnar nerve is closest to the skin. Working with a physical therapist in less severe cases. This may help to: Decrease your symptoms. Improve the strength and range of motion of your elbow, forearm, and hand. If these treatments do not help, surgery may be needed. Follow these instructions at home: If you have a splint: Wear the splint as told by your health care provider. Remove it only as told by your health care provider. Loosen the splint if your fingers tingle, become numb, or turn cold and blue. Keep the splint clean. If the splint is not waterproof: Do not let it get wet. Cover it with a watertight covering when you take a bath or shower. Managing pain, stiffness, and swelling  If directed, put ice on the injured area: Put ice in a plastic bag. Place a  towel between your skin and the bag. Leave the ice on for 20 minutes, 2-3 times a day. Move your fingers often to avoid stiffness and to lessen swelling. Raise (elevate) the injured area above the level of your heart while you are sitting or lying down.  General instructions Take over-the-counter and prescription medicines only as told by your health care provider. Do any exercise or physical therapy as told by your health care provider. Do not  drive or use heavy machinery while taking prescription pain medicine. If you were given an elbow pad, wear it as told by your health care provider. Keep all follow-up visits as told by your health care provider. This is important. Contact a health care provider if: Your symptoms get worse. Your symptoms do not get better with treatment. You have new pain. Your hand on the injured side feels numb or cold. Summary Cubital tunnel syndrome is a condition that causes pain and weakness of the forearm and hand. You are more likely to develop this condition if you do work or play sports that involve repeated arm movements. This condition is often treated by stopping repetitive activities, applying ice, and using anti-inflammatory medicines. In rare cases, surgery may be needed. This information is not intended to replace advice given to you by your health care provider. Make sure you discuss any questions you have with your healthcare provider. Document Revised: 07/24/2017 Document Reviewed: 07/24/2017 Elsevier Patient Education  [place] wear your seat belt while driving or riding in a vehicle. Do not drive: If you have been drinking alcohol. Do not ride with someone who has been drinking. When you are tired or distracted. While texting. Wear a helmet and other protective equipment during sports activities. If you have firearms in your house, make sure you follow all gun safety procedures. What's next? Go to your health care provider once a year for an annual wellness visit. Ask your health care provider how often you should have your eyes and teeth checked. Stay up to date on all vaccines. This information is not intended to replace advice given to you by your health care provider. Make sure you discuss any questions you have with your healthcare provider. Document Revised: 12/04/2018 Document Reviewed: 03/01/2018 Elsevier Patient Education  2022 [employer].

## 2020-10-14 ENCOUNTER — Other Ambulatory Visit: Payer: Self-pay | Admitting: Family Medicine

## 2020-10-14 DIAGNOSIS — M25521 Pain in right elbow: Secondary | ICD-10-CM

## 2020-10-19 DIAGNOSIS — M5412 Radiculopathy, cervical region: Secondary | ICD-10-CM | POA: Diagnosis not present

## 2020-12-12 ENCOUNTER — Other Ambulatory Visit: Payer: Self-pay

## 2020-12-12 ENCOUNTER — Ambulatory Visit (INDEPENDENT_AMBULATORY_CARE_PROVIDER_SITE_OTHER): Payer: Medicare Other

## 2020-12-12 DIAGNOSIS — Z0000 Encounter for general adult medical examination without abnormal findings: Secondary | ICD-10-CM

## 2020-12-12 NOTE — Progress Notes (Signed)
No show for Medicare Wellness Visit.

## 2021-01-07 ENCOUNTER — Ambulatory Visit: Payer: 59

## 2021-02-04 ENCOUNTER — Telehealth: Payer: Self-pay | Admitting: Family Medicine

## 2021-02-04 DIAGNOSIS — E782 Mixed hyperlipidemia: Secondary | ICD-10-CM

## 2021-02-04 DIAGNOSIS — I10 Essential (primary) hypertension: Secondary | ICD-10-CM

## 2021-02-04 MED ORDER — ENALAPRIL MALEATE 5 MG PO TABS: 5.0000 mg | ORAL_TABLET | Freq: Every day | ORAL | 0 refills | Status: DC

## 2021-02-04 MED ORDER — PRAVASTATIN SODIUM 40 MG PO TABS
40.0000 mg | ORAL_TABLET | Freq: Every day | ORAL | 0 refills | Status: DC
Start: 2021-02-04 — End: 2021-04-26

## 2021-02-04 NOTE — Telephone Encounter (Signed)
[facility] faxed refill request for the following medications:  pravastatin (PRAVACHOL) 40 MG tablet   enalapril (VASOTEC) 5 MG tablet   Please advise.

## 2021-04-19 NOTE — Progress Notes (Signed)
Complete physical exam   Patient: Anthony Hood   DOB: 17-May-1960   61 y.o. Male  MRN: [id number] Visit Date: 04/20/2021  Today's healthcare provider: Lelon Huh, MD   No chief complaint on file.  Subjective    Anthony Hood is a 61 y.o. male who presents today for a complete physical exam.  He reports consuming a  no bread or red meat  diet.  He generally feels well. He reports sleeping well. He does have additional problems to discuss today.   He reports that he has had a rash I inguinal region for several months. Has tried several OTC creams and states that Vaseline eases up the itching, but only briefly.   He is also due for follow up of hypertension and hyperlipidemia. States he is taking pravastatin and enalapril consistently without adverse effects. He also history of gout and takes allopurinol everyday for uric acid. States he rarely has gout flares on allopurinol, but when he does, prednisone is effective.   Past Medical History:  Diagnosis Date   Hyperlipidemia    Hypertension    Past Surgical History:  Procedure Laterality Date   COLONOSCOPY WITH PROPOFOL N/A 07/20/2015   Procedure: COLONOSCOPY WITH PROPOFOL;  Surgeon: Manya Silvas, MD;  Location[facility] ENDOSCOPY;  Service: Endoscopy;  Laterality: N/A;   HERNIA REPAIR     umbilical   SHOULDER SURGERY     Social History   Socioeconomic History   Marital status: Married    Spouse name: Not on file   Number of children: 0   Years of education: Not on file   Highest education level: High school graduate  Occupational History   Occupation: disability  Tobacco Use   Smoking status: Never   Smokeless tobacco: Never  Vaping Use   Vaping Use: Never used  Substance and Sexual Activity   Alcohol use: Yes    Alcohol/week: 1.0 - 2.0 standard drink    Types: 1 - 2 Cans of beer per week   Drug use: No   Sexual activity: Not on file  Other Topics Concern   Not on file  Social History Narrative   2  stepchildren   Social Determinants of Health   Financial Resource Strain: Not on file  Food Insecurity: Not on file  Transportation Needs: Not on file  Physical Activity: Not on file  Stress: Not on file  Social Connections: Not on file  Intimate Partner Violence: Not on file   Family Status  Relation Name Status   Mother  Deceased   Father  Deceased   Sister  Alive   Brother  Alive   Sister  Alive   Sister  Alive   Sister  Alive   Sister  Alive   Sister  Alive   Brother  Alive   Family History  Problem Relation Age of Onset   COPD Mother    Heart disease Father    Hypertension Sister    Seizures Sister    No Known Allergies  Patient Care Team: Birdie Sons, MD as PCP - General (Family Medicine) Odette Fraction (Optometry) Nat Christen, MD as Attending Physician (Optometry)   Medications: Outpatient Medications Prior to Visit  Medication Sig   allopurinol (ZYLOPRIM) 100 MG tablet Take 1 tablet (100 mg total) by mouth daily.   enalapril (VASOTEC) 5 MG tablet Take 1 tablet (5 mg total) by mouth daily.   naproxen (NAPROSYN) 500 MG tablet Take 1 tablet (500 mg  total) by mouth 2 (two) times daily with a meal.   pravastatin (PRAVACHOL) 40 MG tablet Take 1 tablet (40 mg total) by mouth daily.   predniSONE (DELTASONE) 50 MG tablet Take 1 tablet (50 mg total) by mouth daily with breakfast.   No facility-administered medications prior to visit.    Review of Systems  Constitutional: Negative.   HENT: Negative.    Eyes: Negative.   Respiratory: Negative.    Cardiovascular: Negative.   Gastrointestinal: Negative.   Endocrine: Negative.   Genitourinary: Negative.   Musculoskeletal: Negative.   Skin: Negative.   Allergic/Immunologic: Negative.   Neurological: Negative.   Hematological: Negative.   Psychiatric/Behavioral: Negative.       Objective    BP 130/78 (BP Location: Left Arm, Patient Position: Sitting, Cuff Size: Normal)    Pulse 78    Temp  98.5 F (36.9 C) (Oral)    Ht 6\' 1"  (1.854 m)    Wt 210 lb (95.3 kg)    SpO2 100%    BMI 27.71 kg/m      General Appearance:     Well developed, well nourished male. Alert, cooperative, in no acute distress, appears stated age  Head:    Normocephalic, without obvious abnormality, atraumatic  Eyes:    PERRL, conjunctiva/corneas clear, EOM's intact, fundi    benign, both eyes       Ears:    Normal TM's and external ear canals, both ears  Neck:   Supple, symmetrical, trachea midline, no adenopathy;       thyroid:  No enlargement/tenderness/nodules; no carotid   bruit or JVD  Back:     Symmetric, no curvature, ROM normal, no CVA tenderness  Lungs:     Clear to auscultation bilaterally, respirations unlabored  Chest wall:    No tenderness or deformity  Heart:    Normal heart rate. Normal rhythm. No murmurs, rubs, or gallops.  S1 and S2 normal  Abdomen:     Soft, non-tender, bowel sounds active all four quadrants,    no masses, no organomegaly  Genitalia:     Bilateral inguinal  rash c/w tinea cruris  Rectal:    deferred  Extremities:   All extremities are intact. No cyanosis or edema  Pulses:   2+ and symmetric all extremities  Skin:   Skin color, texture, turgor normal, no rashes or lesions  Lymph nodes:   Cervical, supraclavicular, and axillary nodes normal  Neurologic:   CNII-XII intact. Normal strength, sensation and reflexes      throughout      Assessment & Plan    Routine Health Maintenance and Physical Exam  Exercise Activities and Dietary recommendations  Goals      DIET - INCREASE LEAN PROTEINS     Recommend to cut back on red meats and eat more chicken, [place] or fish.         Immunization History  Administered Date(s) Administered   Influenza Split 01/18/2010   Influenza,inj,Quad PF,6+ Mos 01/03/2013, 12/16/2013, 05/19/2016, 01/09/2018, 02/08/2019, 03/25/2020   PFIZER(Purple Top)SARS-COV-2 Vaccination 07/01/2019, 07/24/2019, 03/25/2020   Tdap 05/19/2016     Health Maintenance  Topic Date Due   Zoster Vaccines- Shingrix (1 of 2) Never done   COVID-19 Vaccine (4 - Booster for Pfizer series) 05/20/2020   INFLUENZA VACCINE  10/19/2020   COLONOSCOPY (Pts 45-69yrs Insurance coverage will need to be confirmed)  07/19/2025   TETANUS/TDAP  05/20/2026   Hepatitis C Screening  Completed   HIV Screening  Completed  HPV VACCINES  Aged Out    Discussed health benefits of physical activity, and encouraged him to engage in regular exercise appropriate for his age and condition.  1. Prostate cancer screening  - PSA Total (Reflex To Free) ([facility] only)  2. Need for influenza vaccination  - Flu Vaccine QUAD 36+ mos IM (Fluarix/Fluzone)  3. Tinea cruris  - ketoconazole (NIZORAL) 2 % cream; Apply 1 application topically daily.  Dispense: 45 g; Refill: 1  4. Essential (primary) hypertension Well controlled.  Continue current medications.    5. Hyperuricemia Very rare gout flares on current dose of allopurinol.  - Uric acid  6. Mixed hyperlipidemia He is tolerating pravastatin well with no adverse effects.   - CBC - Comprehensive metabolic panel - Lipid panel  7. History of adenomatous polyp of colon Several diminutive polyps excised by Dr. Tiffany Kocher in 2017, but we have no path report on record.. Requested path report from [place],acting as a scribe for Lelon Huh, MD.,have documented all relevant documentation on the behalf of Lelon Huh, MD,as directed by  Lelon Huh, MD while in the presence of Lelon Huh, MD.  The entirety of the information documented in the History of Present Illness, Review of Systems and Physical Exam were personally obtained by me. Portions of this information were initially documented by the CMA and reviewed by me for thoroughness and accuracy.     Lelon Huh, MD  [facility] [phone] (phone) [phone] (fax)  [place]

## 2021-04-20 ENCOUNTER — Ambulatory Visit (INDEPENDENT_AMBULATORY_CARE_PROVIDER_SITE_OTHER): Payer: Medicare Other | Admitting: Family Medicine

## 2021-04-20 ENCOUNTER — Other Ambulatory Visit: Payer: Self-pay

## 2021-04-20 ENCOUNTER — Encounter: Payer: Self-pay | Admitting: Family Medicine

## 2021-04-20 VITALS — BP 130/78 | HR 78 | Temp 98.5°F | Ht 73.0 in | Wt 210.0 lb

## 2021-04-20 DIAGNOSIS — E782 Mixed hyperlipidemia: Secondary | ICD-10-CM | POA: Diagnosis not present

## 2021-04-20 DIAGNOSIS — Z125 Encounter for screening for malignant neoplasm of prostate: Secondary | ICD-10-CM | POA: Diagnosis not present

## 2021-04-20 DIAGNOSIS — B356 Tinea cruris: Secondary | ICD-10-CM

## 2021-04-20 DIAGNOSIS — Z0000 Encounter for general adult medical examination without abnormal findings: Secondary | ICD-10-CM

## 2021-04-20 DIAGNOSIS — Z86010 Personal history of colonic polyps: Secondary | ICD-10-CM | POA: Diagnosis not present

## 2021-04-20 DIAGNOSIS — Z23 Encounter for immunization: Secondary | ICD-10-CM | POA: Diagnosis not present

## 2021-04-20 DIAGNOSIS — E790 Hyperuricemia without signs of inflammatory arthritis and tophaceous disease: Secondary | ICD-10-CM

## 2021-04-20 DIAGNOSIS — Z860101 Personal history of adenomatous and serrated colon polyps: Secondary | ICD-10-CM

## 2021-04-20 DIAGNOSIS — I10 Essential (primary) hypertension: Secondary | ICD-10-CM | POA: Diagnosis not present

## 2021-04-20 MED ORDER — KETOCONAZOLE 2 % EX CREA: 1.0000 "application " | TOPICAL_CREAM | Freq: Every day | CUTANEOUS | 1 refills | Status: DC

## 2021-04-20 NOTE — Patient Instructions (Signed)
.   Please review the attached list of medications and notify my office if there are any errors.   . Please bring all of your medications to every appointment so we can make sure that our medication list is the same as yours.   

## 2021-04-20 NOTE — Progress Notes (Signed)
Annual Wellness Visit     Patient: Anthony Hood, Male    DOB: January 04, 1961, 61 y.o.   MRN: [id number] Visit Date: 04/20/2021  Today's Provider: Lelon Huh, MD   Chief Complaint  Patient presents with   Hyperlipidemia   Hypertension   Annual Exam   Subjective    Anthony Hood is a 61 y.o. male who presents today for his Annual Wellness Visit.    Medications: Outpatient Medications Prior to Visit  Medication Sig   allopurinol (ZYLOPRIM) 100 MG tablet Take 1 tablet (100 mg total) by mouth daily.   enalapril (VASOTEC) 5 MG tablet Take 1 tablet (5 mg total) by mouth daily.   naproxen (NAPROSYN) 500 MG tablet Take 1 tablet (500 mg total) by mouth 2 (two) times daily with a meal.   pravastatin (PRAVACHOL) 40 MG tablet Take 1 tablet (40 mg total) by mouth daily.   [DISCONTINUED] predniSONE (DELTASONE) 50 MG tablet Take 1 tablet (50 mg total) by mouth daily with breakfast.   No facility-administered medications prior to visit.    No Known Allergies  Patient Care Team: Birdie Sons, MD as PCP - General (Family Medicine) Odette Fraction (Optometry) Nat Christen, MD as Attending Physician (Optometry) Alice Reichert, Benay Pike, MD as Consulting Physician (Gastroenterology)        Objective     Most recent functional status assessment: In your present state of health, do you have any difficulty performing the following activities: 04/20/2021  Hearing? N  Vision? N  Difficulty concentrating or making decisions? N  Walking or climbing stairs? N  Dressing or bathing? N  Doing errands, shopping? N  Some recent data might be hidden   Most recent fall risk assessment: Fall Risk  04/20/2021  Falls in the past year? 0  Number falls in past yr: 0  Injury with Fall? 0  Risk for fall due to : -  Follow up -    Most recent depression screenings: PHQ 2/9 Scores 04/20/2021 10/13/2020  PHQ - 2 Score 0 0  PHQ- 9 Score 0 0   Most recent cognitive screening: No  flowsheet data found. Most recent Audit-C alcohol use screening Alcohol Use Disorder Test (AUDIT) 04/20/2021  1. How often do you have a drink containing alcohol? 4  2. How many drinks containing alcohol do you have on a typical day when you are drinking? 0  3. How often do you have six or more drinks on one occasion? 3  AUDIT-C Score 7  4. How often during the last year have you found that you were not able to stop drinking once you had started? -  5. How often during the last year have you failed to do what was normally expected from you because of drinking? -  6. How often during the last year have you needed a first drink in the morning to get yourself going after a heavy drinking session? -  7. How often during the last year have you had a feeling of guilt of remorse after drinking? -  8. How often during the last year have you been unable to remember what happened the night before because you had been drinking? -  9. Have you or someone else been injured as a result of your drinking? -  10. Has a relative or friend or a doctor or another health worker been concerned about your drinking or suggested you cut down? -  Alcohol Use Disorder Identification Test Final Score (AUDIT) -  Alcohol Brief Interventions/Follow-up -   A score of 3 or more in women, and 4 or more in men indicates increased risk for alcohol abuse, EXCEPT if all of the points are from question 1   No results found for any visits on 04/20/21.  Assessment & Plan     Annual wellness visit done today including the all of the following: Reviewed patient's Family Medical History Reviewed and updated list of patient's medical providers Assessment of cognitive impairment was done Assessed patient's functional ability Established a written schedule for health screening [place] Completed and Reviewed  Exercise Activities and Dietary recommendations  Goals      DIET - INCREASE LEAN PROTEINS      Recommend to cut back on red meats and eat more chicken, [place] or fish.         Immunization History  Administered Date(s) Administered   Influenza Split 01/18/2010   Influenza,inj,Quad PF,6+ Mos 01/03/2013, 12/16/2013, 05/19/2016, 01/09/2018, 02/08/2019, 03/25/2020, 04/20/2021   PFIZER(Purple Top)SARS-COV-2 Vaccination 07/01/2019, 07/24/2019, 03/25/2020   Tdap 05/19/2016    Health Maintenance  Topic Date Due   Zoster Vaccines- Shingrix (1 of 2) Never done   COVID-19 Vaccine (4 - Booster for Pfizer series) 05/20/2020   COLONOSCOPY (Pts 45-52yrs Insurance coverage will need to be confirmed)  07/19/2020   TETANUS/TDAP  05/20/2026   INFLUENZA VACCINE  Completed   Hepatitis C Screening  Completed   HIV Screening  Completed   HPV VACCINES  Aged Out     Discussed health benefits of physical activity, and encouraged him to engage in regular exercise appropriate for his age and condition.           The entirety of the information documented in the History of Present Illness, Review of Systems and Physical Exam were personally obtained by me. Portions of this information were initially documented by the CMA and reviewed by me for thoroughness and accuracy.     Lelon Huh, MD  [facility] [phone] (phone) [phone] (fax)  [place]

## 2021-04-21 LAB — PSA TOTAL (REFLEX TO FREE): Prostate Specific Ag, Serum: 0.6 ng/mL (ref 0.0–4.0)

## 2021-04-21 LAB — URIC ACID: Uric Acid: 8.1 mg/dL (ref 3.8–8.4)

## 2021-04-21 LAB — CBC
Hematocrit: 42.2 % (ref 37.5–51.0)
Hemoglobin: 14.2 g/dL (ref 13.0–17.7)
MCH: 29.1 pg (ref 26.6–33.0)
MCHC: 33.6 g/dL (ref 31.5–35.7)
MCV: 87 fL (ref 79–97)
Platelets: 261 10*3/uL (ref 150–450)
RBC: 4.88 x10E6/uL (ref 4.14–5.80)
RDW: 12.6 % (ref 11.6–15.4)
WBC: 6.1 10*3/uL (ref 3.4–10.8)

## 2021-04-21 LAB — COMPREHENSIVE METABOLIC PANEL
ALT: 36 IU/L (ref 0–44)
AST: 35 IU/L (ref 0–40)
Albumin/Globulin Ratio: 1.6 (ref 1.2–2.2)
Albumin: 4.4 g/dL (ref 3.8–4.8)
Alkaline Phosphatase: 61 IU/L (ref 44–121)
BUN/Creatinine Ratio: 13 (ref 10–24)
BUN: 17 mg/dL (ref 8–27)
Bilirubin Total: 0.2 mg/dL (ref 0.0–1.2)
CO2: 22 mmol/L (ref 20–29)
Calcium: 9.5 mg/dL (ref 8.6–10.2)
Chloride: 104 mmol/L (ref 96–106)
Creatinine, Ser: 1.26 mg/dL (ref 0.76–1.27)
Globulin, Total: 2.7 g/dL (ref 1.5–4.5)
Glucose: 87 mg/dL (ref 70–99)
Potassium: 4.6 mmol/L (ref 3.5–5.2)
Sodium: 140 mmol/L (ref 134–144)
Total Protein: 7.1 g/dL (ref 6.0–8.5)
eGFR: 65 mL/min/{1.73_m2} (ref >59)

## 2021-04-21 LAB — LIPID PANEL
Chol/HDL Ratio: 3.7 ratio (ref 0.0–5.0)
Cholesterol, Total: 256 mg/dL — ABNORMAL HIGH (ref 100–199)
HDL: 69 mg/dL (ref >39)
LDL Chol Calc (NIH): 168 mg/dL — ABNORMAL HIGH (ref 0–99)
Triglycerides: 107 mg/dL (ref 0–149)
VLDL Cholesterol Cal: 19 mg/dL (ref 5–40)

## 2021-04-26 ENCOUNTER — Telehealth: Payer: Self-pay

## 2021-04-26 DIAGNOSIS — I10 Essential (primary) hypertension: Secondary | ICD-10-CM

## 2021-04-26 DIAGNOSIS — E782 Mixed hyperlipidemia: Secondary | ICD-10-CM

## 2021-04-26 MED ORDER — ENALAPRIL MALEATE 5 MG PO TABS: 5.0000 mg | ORAL_TABLET | Freq: Every day | ORAL | 1 refills | Status: DC

## 2021-04-26 MED ORDER — PRAVASTATIN SODIUM 80 MG PO TABS: 80.0000 mg | ORAL_TABLET | Freq: Every day | ORAL | 1 refills | Status: DC

## 2021-04-26 NOTE — Telephone Encounter (Signed)
-----   Message from Birdie Sons, MD sent at 04/21/2021  5:06 PM EST ----- His cholesterol too high at 256, it should be under 200. Need to increase pravastatin to 80mg  one tablet daily, #90, rf x 1. Please send to pharmacy and schedule follow up in 2-3 months.  Also, please see if he needs a refill for enalapril, we haven't refilled it since November. If he needs refill can send in #90 rf x 1.

## 2021-04-26 NOTE — Telephone Encounter (Signed)
Patient advised and agrees with treatment plan. Patient says he does need a refill for enalapril. Prescriptions sent into pharmacy. Follow up appointment scheduled for 07/20/2021.

## 2021-04-28 ENCOUNTER — Encounter: Payer: Self-pay | Admitting: Unknown Physician Specialty

## 2021-07-20 ENCOUNTER — Ambulatory Visit: Payer: Medicare Other | Admitting: Family Medicine

## 2021-07-20 NOTE — Progress Notes (Deleted)
Established patient visit   Patient: Anthony Hood   DOB: 03/02/1961   61 y.o. Male  MRN: [id number] Visit Date: 07/20/2021  Today's healthcare provider: Lelon Huh, MD   No chief complaint on file.  Subjective    HPI  Hypertension, follow-up  BP Readings from Last 3 Encounters:  04/20/21 130/78  10/13/20 131/82  09/29/20 (!) 152/92   Wt Readings from Last 3 Encounters:  04/20/21 210 lb (95.3 kg)  10/13/20 209 lb 6.4 oz (95 kg)  09/29/20 251 lb (113.9 kg)     He was last seen for hypertension 3 months ago.  BP at that visit was 130/78. Management since that visit includes no changes.  He reports {excellent/good/fair/poor:19665} compliance with treatment. He {is/is not:9024} having side effects. {document side effects if present:1} He is following a {diet:21022986} diet. He {is/is not:9024} exercising. He {does/does not:200015} smoke.  Use of agents associated with hypertension: {bp agents assoc with hypertension:511::"none"}.   Outside blood pressures are {***enter patient reported home BP readings, or 'not being checked':1}. Symptoms: {Yes/No:20286} chest pain {Yes/No:20286} chest pressure  {Yes/No:20286} palpitations {Yes/No:20286} syncope  {Yes/No:20286} dyspnea {Yes/No:20286} orthopnea  {Yes/No:20286} paroxysmal nocturnal dyspnea {Yes/No:20286} lower extremity edema   Pertinent labs Lab Results  Component Value Date   CHOL 256 (H) 04/20/2021   HDL 69 04/20/2021   LDLCALC 168 (H) 04/20/2021   TRIG 107 04/20/2021   CHOLHDL 3.7 04/20/2021   Lab Results  Component Value Date   NA 140 04/20/2021   K 4.6 04/20/2021   CREATININE 1.26 04/20/2021   EGFR 65 04/20/2021   GLUCOSE 87 04/20/2021   TSH 0.909 09/05/2019     The 10-year ASCVD risk score (Arnett DK, et al., 2019) is: 14%  ---------------------------------------------------------------------------------------------------  Lipid/Cholesterol, Follow-up  Last lipid panel Other pertinent labs   Lab Results  Component Value Date   CHOL 256 (H) 04/20/2021   HDL 69 04/20/2021   LDLCALC 168 (H) 04/20/2021   TRIG 107 04/20/2021   CHOLHDL 3.7 04/20/2021   Lab Results  Component Value Date   ALT 36 04/20/2021   AST 35 04/20/2021   PLT 261 04/20/2021   TSH 0.909 09/05/2019     He was last seen for this 3 months ago.  Management since that visit includes increase pravastatin to 80m one tablet .  He reports {excellent/good/fair/poor:19665} compliance with treatment. He {is/is not:9024} having side effects. {document side effects if present:1}  Symptoms: {Yes/No:20286} chest pain {Yes/No:20286} chest pressure/discomfort  {Yes/No:20286} dyspnea {Yes/No:20286} lower extremity edema  {Yes/No:20286} numbness or tingling of extremity {Yes/No:20286} orthopnea  {Yes/No:20286} palpitations {Yes/No:20286} paroxysmal nocturnal dyspnea  {Yes/No:20286} speech difficulty {Yes/No:20286} syncope   Current diet: {diet habits:16563} Current exercise: {exercise types:16438}  The 10-year ASCVD risk score (Arnett DK, et al., 2019) is: 14%  ---------------------------------------------------------------------------------------------------   Medications: Outpatient Medications Prior to Visit  Medication Sig   allopurinol (ZYLOPRIM) 100 MG tablet Take 1 tablet (100 mg total) by mouth daily.   enalapril (VASOTEC) 5 MG tablet Take 1 tablet (5 mg total) by mouth daily.   ketoconazole (NIZORAL) 2 % cream Apply 1 application topically daily.   naproxen (NAPROSYN) 500 MG tablet Take 1 tablet (500 mg total) by mouth 2 (two) times daily with a meal.   pravastatin (PRAVACHOL) 80 MG tablet Take 1 tablet (80 mg total) by mouth daily.   No facility-administered medications prior to visit.    Review of Systems  {Labs  Heme  Chem  Endocrine  Serology  Results Review (  optional):23779}   Objective    There were no vitals taken for this visit. {Show previous vital signs  (optional):23777}  Physical Exam  ***  No results found for any visits on 07/20/21.  Assessment & Plan     ***  No follow-ups on file.      {provider attestation***:1}   Lelon Huh, MD  [facility] [phone] (phone) [phone] (fax)  [place]

## 2021-08-30 ENCOUNTER — Ambulatory Visit (LOCAL_COMMUNITY_HEALTH_CENTER): Payer: Medicare Other

## 2021-08-30 DIAGNOSIS — Z9289 Personal history of other medical treatment: Secondary | ICD-10-CM

## 2021-08-30 NOTE — Progress Notes (Signed)
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  States hx +PPD, negative CXR, and treatment he thinks when he was 49 or 61 years old.   Located past record of TB screening form  re: + PPD and negative CXR.   Record of Tuberculosis Screening form completed.  Copy provided to pt.    Tonny Branch, RN

## 2022-01-07 ENCOUNTER — Other Ambulatory Visit: Payer: Self-pay | Admitting: Family Medicine

## 2022-01-07 DIAGNOSIS — I10 Essential (primary) hypertension: Secondary | ICD-10-CM

## 2022-01-07 DIAGNOSIS — E782 Mixed hyperlipidemia: Secondary | ICD-10-CM

## 2022-05-23 ENCOUNTER — Telehealth: Payer: Self-pay | Admitting: Family Medicine

## 2022-05-23 NOTE — Telephone Encounter (Signed)
Called patient to schedule Medicare Annual Wellness Visit (AWV). Left message for patient to call back and schedule Medicare Annual Wellness Visit (AWV).  Last date of AWV: 04/02/2021  Please schedule an appointment at any time with [facility] .  If any questions, please contact me at [phone].  Thank you ,  [place] Direct Dial: [phone]

## 2022-05-30 ENCOUNTER — Other Ambulatory Visit: Payer: Self-pay | Admitting: Family Medicine

## 2022-05-30 DIAGNOSIS — E782 Mixed hyperlipidemia: Secondary | ICD-10-CM

## 2022-05-30 DIAGNOSIS — I10 Essential (primary) hypertension: Secondary | ICD-10-CM

## 2022-05-31 NOTE — Telephone Encounter (Signed)
Requested medication (s) are due for refill today: Yes  Requested medication (s) are on the active medication list: Yes  Last refill:  01/07/22  Future visit scheduled: No  Notes to clinic:  Left message on both phone numbers to make appointment.    Requested Prescriptions  Pending Prescriptions Disp Refills   enalapril (VASOTEC) 5 MG tablet [Pharmacy Med Name: Enalapril Maleate 5 MG Oral Tablet] 90 tablet 0    Sig: TAKE 1 TABLET BY MOUTH ONCE DAILY . APPOINTMENT REQUIRED FOR FUTURE REFILLS     Cardiovascular:  ACE Inhibitors Failed - 05/30/2022  1:32 PM      Failed - Cr in normal range and within 180 days    Creatinine, Ser  Date Value Ref Range Status  04/20/2021 1.26 0.76 - 1.27 mg/dL Final         Failed - K in normal range and within 180 days    Potassium  Date Value Ref Range Status  04/20/2021 4.6 3.5 - 5.2 mmol/L Final  04/02/2012 4.3 3.5 - 5.1 mmol/L Final         Failed - Valid encounter within last 6 months    Recent Outpatient Visits           1 year ago Prostate cancer screening   [place], Donald E, MD   1 year ago Right elbow pain   [place] [facility], Vickki Muff, PA-C   2 years ago Annual physical exam   [place] [facility], Vickki Muff, PA-C   3 years ago Annual physical exam   [place] [facility], Vickki Muff, PA-C   4 years ago Pain in toe of left foot   [place], Vickki Muff, [place] - Patient is not pregnant      Passed - Last BP in normal range    BP Readings from Last 1 Encounters:  04/20/21 130/78          pravastatin (PRAVACHOL) 80 MG tablet [Pharmacy Med Name: Pravastatin Sodium 80 MG Oral Tablet] 90 tablet 0    Sig: TAKE 1 TABLET BY MOUTH ONCE DAILY . APPOINTMENT REQUIRED FOR FUTURE REFILLS     Cardiovascular:  Antilipid - Statins Failed - 05/30/2022  1:32 PM       Failed - Valid encounter within last 12 months    Recent Outpatient Visits           1 year ago Prostate cancer screening   [place], Donald E, MD   1 year ago Right elbow pain   [place] Chrism[place], Vickki Muff, [place]   2 years ago Annual physical exam   [place], PA-C   3 years ago Annual physical exam   [place] [facility], Vickki Muff, PA-C   4 years ago Pain in toe of left foot   [place], PA-C              Failed - Lipid Panel in normal range within the last 12 months    Cholesterol, Total  Date Value Ref Range Status  04/20/2021 256 (H) 100 - 199 mg/dL Final   LDL Chol Calc (NIH)  Date Value Ref Range Status  04/20/2021 168 (H) 0 -  99 mg/dL Final   HDL  Date Value Ref Range Status  04/20/2021 69 >39 mg/dL Final   Triglycerides  Date Value Ref Range Status  04/20/2021 107 0 - 149 mg/dL Final         Passed - Patient is not pregnant

## 2022-06-06 NOTE — Telephone Encounter (Signed)
Pt called and scheduled an office visit for 06/24/22. Pt is wanting to see if he can get a short supply of medication sent to the pharmacy until his appt.  [place] ([place]), [place] - [address] Phone: [phone]  Fax: [phone]

## 2022-06-13 NOTE — Telephone Encounter (Signed)
Pt and wife called to f/u medication refill asked if it was possible to get a courtesy refill to get him through until his appointment. Pt is scheduled for 06/24/22.   Please advise.

## 2022-06-21 NOTE — Telephone Encounter (Signed)
Pt called asking if he can get enough refills until he comes in on the 5th. Please advise  [phone]

## 2022-06-22 NOTE — Progress Notes (Deleted)
Established patient visit   Patient: Anthony Hood   DOB: 1960-04-30   62 y.o. Male  MRN: [id number] Visit Date: 06/24/2022  Today's healthcare provider: Lelon Huh, MD   No chief complaint on file.  Subjective    HPI  Hypertension, follow-up  BP Readings from Last 3 Encounters:  04/20/21 130/78  10/13/20 131/82  09/29/20 (!) 152/92   Wt Readings from Last 3 Encounters:  04/20/21 210 lb (95.3 kg)  10/13/20 209 lb 6.4 oz (95 kg)  09/29/20 251 lb (113.9 kg)     He was last seen for hypertension 15 months ago.  BP at that visit was as above. Management since that visit includes no known changes.  He reports {excellent/good/fair/poor:19665} compliance with treatment. He {is/is not:9024} having side effects. {document side effects if present:1} He is following a {diet:21022986} diet. He {is/is not:9024} exercising. He {does/does not:200015} smoke.  Use of agents associated with hypertension: {bp agents assoc with hypertension:511::"none"}.   Outside blood pressures are {***enter patient reported home BP readings, or 'not being checked':1}. Symptoms: {Yes/No:20286} chest pain {Yes/No:20286} chest pressure  {Yes/No:20286} palpitations {Yes/No:20286} syncope  {Yes/No:20286} dyspnea {Yes/No:20286} orthopnea  {Yes/No:20286} paroxysmal nocturnal dyspnea {Yes/No:20286} lower extremity edema   Pertinent labs Lab Results  Component Value Date   CHOL 256 (H) 04/20/2021   HDL 69 04/20/2021   LDLCALC 168 (H) 04/20/2021   TRIG 107 04/20/2021   CHOLHDL 3.7 04/20/2021   Lab Results  Component Value Date   NA 140 04/20/2021   K 4.6 04/20/2021   CREATININE 1.26 04/20/2021   EGFR 65 04/20/2021   GLUCOSE 87 04/20/2021   TSH 0.909 09/05/2019     The ASCVD Risk score (Arnett DK, et al., 2019) failed to calculate for the following reasons:   The systolic blood pressure is  missing  --------------------------------------------------------------------------------------------------- Lipid/Cholesterol, Follow-up  Last lipid panel Other pertinent labs  Lab Results  Component Value Date   CHOL 256 (H) 04/20/2021   HDL 69 04/20/2021   LDLCALC 168 (H) 04/20/2021   TRIG 107 04/20/2021   CHOLHDL 3.7 04/20/2021   Lab Results  Component Value Date   ALT 36 04/20/2021   AST 35 04/20/2021   PLT 261 04/20/2021   TSH 0.909 09/05/2019     He was last seen for this 15 months ago.  Management since that visit includes no known changes.  He reports {excellent/good/fair/poor:19665} compliance with treatment. He {is/is not:9024} having side effects. {document side effects if present:1}  Symptoms: {Yes/No:20286} chest pain {Yes/No:20286} chest pressure/discomfort  {Yes/No:20286} dyspnea {Yes/No:20286} lower extremity edema  {Yes/No:20286} numbness or tingling of extremity {Yes/No:20286} orthopnea  {Yes/No:20286} palpitations {Yes/No:20286} paroxysmal nocturnal dyspnea  {Yes/No:20286} speech difficulty {Yes/No:20286} syncope   Current diet: {diet habits:16563} Current exercise: {exercise types:16438}  The ASCVD Risk score (Arnett DK, et al., 2019) failed to calculate for the following reasons:   The systolic blood pressure is missing  ---------------------------------------------------------------------------------------------------   Medications: Outpatient Medications Prior to Visit  Medication Sig   allopurinol (ZYLOPRIM) 100 MG tablet Take 1 tablet (100 mg total) by mouth daily.   enalapril (VASOTEC) 5 MG tablet Take 1 tablet by mouth once daily   ketoconazole (NIZORAL) 2 % cream Apply 1 application topically daily.   naproxen (NAPROSYN) 500 MG tablet Take 1 tablet (500 mg total) by mouth 2 (two) times daily with a meal.   pravastatin (PRAVACHOL) 80 MG tablet Take 1 tablet by mouth once daily   No facility-administered medications prior to visit.  Review of Systems  {Labs  Heme  Chem  Endocrine  Serology  Results Review (optional):23779}   Objective    There were no vitals taken for this visit. {Show previous vital signs (optional):23777}  Physical Exam  ***  No results found for any visits on 06/24/22.  Assessment & Plan     ***  No follow-ups on file.      {provider attestation***:1}   Lelon Huh, MD  [place] [phone] (phone) [phone] (fax)  [place]

## 2022-06-24 ENCOUNTER — Ambulatory Visit: Payer: Medicare HMO | Admitting: Family Medicine

## 2022-06-27 NOTE — Progress Notes (Unsigned)
[name],acting as a scribe for [name], MD.,have documented all relevant documentation on the behalf of [name], MD,as directed by  [name], MD while in the presence of [name], MD.     Established patient visit   Patient: Anthony Hood   DOB: [birth year]   62 y.o. Male  MRN: [id number] Visit Date: 06/28/2022  Today's healthcare provider: [name], MD   No chief complaint on file.  Subjective    HPI  Hypertension, follow-up  BP Readings from Last 3 Encounters:  04/20/21 130/78  10/13/20 131/82  09/29/20 (!) 152/92   Wt Readings from Last 3 Encounters:  04/20/21 210 lb (95.3 kg)  10/13/20 209 lb 6.4 oz (95 kg)  09/29/20 251 lb (113.9 kg)     He was last seen for hypertension 15 months ago.  BP at that visit was as above. Management since that visit includes none.  He reports {excellent/good/fair/poor:19665} compliance with treatment. He {is/is not:9024} having side effects. {document side effects if present:1} He is following a {diet:21022986} diet. He {is/is not:9024} exercising. He {does/does not:200015} smoke.  Use of agents associated with hypertension: {bp agents assoc with hypertension:511::"none"}.   Outside blood pressures are {***enter patient reported home BP readings, or 'not being checked':1}. Symptoms: {Yes/No:20286} chest pain {Yes/No:20286} chest pressure  {Yes/No:20286} palpitations {Yes/No:20286} syncope  {Yes/No:20286} dyspnea {Yes/No:20286} orthopnea  {Yes/No:20286} paroxysmal nocturnal dyspnea {Yes/No:20286} lower extremity edema   Pertinent labs Lab Results  Component Value Date   CHOL 256 (H) 04/20/2021   HDL 69 04/20/2021   LDLCALC 168 (H) 04/20/2021   TRIG 107 04/20/2021   CHOLHDL 3.7 04/20/2021   Lab Results  Component Value Date   NA 140 04/20/2021   K 4.6 04/20/2021   CREATININE 1.26 04/20/2021   EGFR 65 04/20/2021   GLUCOSE 87 04/20/2021   TSH 0.909 09/05/2019     The ASCVD Risk score  (Arnett DK, et al., 2019) failed to calculate for the following reasons:   The systolic blood pressure is missing  --------------------------------------------------------------------------------------------------- Lipid/Cholesterol, Follow-up  Last lipid panel Other pertinent labs  Lab Results  Component Value Date   CHOL 256 (H) 04/20/2021   HDL 69 04/20/2021   LDLCALC 168 (H) 04/20/2021   TRIG 107 04/20/2021   CHOLHDL 3.7 04/20/2021   Lab Results  Component Value Date   ALT 36 04/20/2021   AST 35 04/20/2021   PLT 261 04/20/2021   TSH 0.909 09/05/2019     He was last seen for this 15 months ago.  Management since that visit includes none.  He reports {excellent/good/fair/poor:19665} compliance with treatment. He {is/is not:9024} having side effects. {document side effects if present:1}  Symptoms: {Yes/No:20286} chest pain {Yes/No:20286} chest pressure/discomfort  {Yes/No:20286} dyspnea {Yes/No:20286} lower extremity edema  {Yes/No:20286} numbness or tingling of extremity {Yes/No:20286} orthopnea  {Yes/No:20286} palpitations {Yes/No:20286} paroxysmal nocturnal dyspnea  {Yes/No:20286} speech difficulty {Yes/No:20286} syncope   Current diet: {diet habits:16563} Current exercise: {exercise types:16438}  The ASCVD Risk score (Arnett DK, et al., 2019) failed to calculate for the following reasons:   The systolic blood pressure is missing  ---------------------------------------------------------------------------------------------------   Medications: Outpatient Medications Prior to Visit  Medication Sig   allopurinol (ZYLOPRIM) 100 MG tablet Take 1 tablet (100 mg total) by mouth daily.   enalapril (VASOTEC) 5 MG tablet Take 1 tablet by mouth once daily   ketoconazole (NIZORAL) 2 % cream Apply 1 application topically daily.   naproxen (NAPROSYN) 500 MG tablet Take 1 tablet (500 mg total) by  mouth 2 (two) times daily with a meal.   pravastatin (PRAVACHOL) 80 MG tablet  Take 1 tablet by mouth once daily   No facility-administered medications prior to visit.    Review of Systems  {Labs  Heme  Chem  Endocrine  Serology  Results Review (optional):23779}   Objective    There were no vitals taken for this visit. {Show previous vital signs (optional):23777}  Physical Exam  ***  No results found for any visits on 06/28/22.  Assessment & Plan     ***  No follow-ups on file.      {provider attestation***:1}   [name], MD  [facility] [phone] (phone) [phone] (fax)  [facility]

## 2022-06-28 ENCOUNTER — Ambulatory Visit (INDEPENDENT_AMBULATORY_CARE_PROVIDER_SITE_OTHER): Payer: Medicare HMO | Admitting: Family Medicine

## 2022-06-28 VITALS — BP 134/94 | HR 75 | Temp 98.6°F | Wt 207.0 lb

## 2022-06-28 DIAGNOSIS — I10 Essential (primary) hypertension: Secondary | ICD-10-CM | POA: Diagnosis not present

## 2022-06-28 DIAGNOSIS — Z125 Encounter for screening for malignant neoplasm of prostate: Secondary | ICD-10-CM | POA: Diagnosis not present

## 2022-06-28 DIAGNOSIS — E782 Mixed hyperlipidemia: Secondary | ICD-10-CM

## 2022-06-29 LAB — LIPID PANEL WITH LDL/HDL RATIO
Cholesterol, Total: 246 mg/dL — ABNORMAL HIGH (ref 100–199)
HDL: 65 mg/dL (ref >39)
LDL Chol Calc (NIH): 156 mg/dL — ABNORMAL HIGH (ref 0–99)
LDL/HDL Ratio: 2.4 ratio (ref 0.0–3.6)
Triglycerides: 139 mg/dL (ref 0–149)
VLDL Cholesterol Cal: 25 mg/dL (ref 5–40)

## 2022-06-29 LAB — COMPREHENSIVE METABOLIC PANEL
ALT: 34 IU/L (ref 0–44)
AST: 24 IU/L (ref 0–40)
Albumin/Globulin Ratio: 1.6 (ref 1.2–2.2)
Albumin: 4.2 g/dL (ref 3.9–4.9)
Alkaline Phosphatase: 76 IU/L (ref 44–121)
BUN/Creatinine Ratio: 11 (ref 10–24)
BUN: 13 mg/dL (ref 8–27)
Bilirubin Total: 0.3 mg/dL (ref 0.0–1.2)
CO2: 22 mmol/L (ref 20–29)
Calcium: 9.2 mg/dL (ref 8.6–10.2)
Chloride: 103 mmol/L (ref 96–106)
Creatinine, Ser: 1.18 mg/dL (ref 0.76–1.27)
Globulin, Total: 2.7 g/dL (ref 1.5–4.5)
Glucose: 112 mg/dL — ABNORMAL HIGH (ref 70–99)
Potassium: 4.3 mmol/L (ref 3.5–5.2)
Sodium: 139 mmol/L (ref 134–144)
Total Protein: 6.9 g/dL (ref 6.0–8.5)
eGFR: 70 mL/min/{1.73_m2} (ref >59)

## 2022-06-29 LAB — CBC WITH DIFFERENTIAL/PLATELET
Basophils Absolute: 0.1 10*3/uL (ref 0.0–0.2)
Basos: 1 %
EOS (ABSOLUTE): 0.1 10*3/uL (ref 0.0–0.4)
Eos: 2 %
Hematocrit: 42.2 % (ref 37.5–51.0)
Hemoglobin: 14.1 g/dL (ref 13.0–17.7)
Immature Grans (Abs): 0 10*3/uL (ref 0.0–0.1)
Immature Granulocytes: 0 %
Lymphocytes Absolute: 1.2 10*3/uL (ref 0.7–3.1)
Lymphs: 16 %
MCH: 28.7 pg (ref 26.6–33.0)
MCHC: 33.4 g/dL (ref 31.5–35.7)
MCV: 86 fL (ref 79–97)
Monocytes Absolute: 0.6 10*3/uL (ref 0.1–0.9)
Monocytes: 8 %
Neutrophils Absolute: 5.4 10*3/uL (ref 1.4–7.0)
Neutrophils: 73 %
Platelets: 252 10*3/uL (ref 150–450)
RBC: 4.92 x10E6/uL (ref 4.14–5.80)
RDW: 12.5 % (ref 11.6–15.4)
WBC: 7.3 10*3/uL (ref 3.4–10.8)

## 2022-06-29 LAB — TSH: TSH: 0.743 u[IU]/mL (ref 0.450–4.500)

## 2022-06-29 LAB — PSA TOTAL (REFLEX TO FREE): Prostate Specific Ag, Serum: 0.7 ng/mL (ref 0.0–4.0)

## 2022-06-29 LAB — LDL CHOLESTEROL, DIRECT: LDL Direct: 155 mg/dL — ABNORMAL HIGH (ref 0–99)

## 2022-07-04 ENCOUNTER — Telehealth: Payer: Self-pay | Admitting: Family Medicine

## 2022-07-04 ENCOUNTER — Other Ambulatory Visit: Payer: Self-pay

## 2022-07-04 MED ORDER — ROSUVASTATIN CALCIUM 10 MG PO TABS: 10.0000 mg | ORAL_TABLET | Freq: Every day | ORAL | 3 refills | Status: DC

## 2022-07-04 NOTE — Telephone Encounter (Signed)
Advised 

## 2022-07-04 NOTE — Telephone Encounter (Signed)
Called patient back for lab results. Patient verbal understanding. Provider's recommendations stated that he was going to change pravastatin 80 mg to rosuvastatin 10 mg. After review of chart, the new rx for rosuvastatin was not sent to patient's pharmacy. Please advise patient when medication has been sent to [facility] pharmacy on [address] in [place], [place].

## 2022-07-04 NOTE — Telephone Encounter (Signed)
Pt called back to discuss his lab results, please advise. Held for NT  [phone]

## 2022-07-05 ENCOUNTER — Telehealth: Payer: Self-pay | Admitting: Family Medicine

## 2022-07-05 NOTE — Telephone Encounter (Signed)
Patient called, left VM to return the call to the office regarding medication request. PCP changed from pravastatin to rosuvastatin 10 mg and it was sent to the pharmacy requested on 07/04/22.

## 2022-07-05 NOTE — Telephone Encounter (Signed)
  Medication Refill - Medication:    Pravastatin (PRAVACHOL) 80 MG tablet has  4 pills left. Patient states PCP forgot to fill at last OV on 4/9.   Patient also states PCP increased MG for his  rosuvastatin (CRESTOR) 10 MG tablet, he picked up new script already.    Has the patient contacted their pharmacy? Yes.    (Agent: If yes, when and what did the pharmacy advise?) Contact PCP office   Preferred Pharmacy (with phone number or street name):   [facility] [address] [place]), [place] - [address] Phone: [phone]  Fax: [phone]      Has the patient been seen for an appointment in the last year OR does the patient have an upcoming appointment? Yes.    Agent: Please be advised that RX refills may take up to 3 business days. We ask that you follow-up with your pharmacy.

## 2022-07-22 ENCOUNTER — Telehealth: Payer: Self-pay | Admitting: Family Medicine

## 2022-07-22 NOTE — Telephone Encounter (Signed)
Copied from CRM [id number]. Topic: Medicare AWV >> Jul 22, 2022 12:09 PM Rushie Goltz wrote: Reason for CRM: Called patient to schedule Medicare Annual Wellness Visit (AWV). Left message for patient to call back and schedule Medicare Annual Wellness Visit (AWV).  Last date of AWV: 04/20/2021  Please schedule an AWVS appointment at any time with [facility] ANNUAL WELLNESS VISIT.  If any questions, please contact me at [phone].    Thank you,  [facility] Support [facility] Direct dial  [phone]

## 2022-08-08 ENCOUNTER — Telehealth: Payer: Self-pay

## 2022-08-08 DIAGNOSIS — I10 Essential (primary) hypertension: Secondary | ICD-10-CM

## 2022-08-08 NOTE — Telephone Encounter (Signed)
Copied from CRM [id number]. Topic: General - Inquiry >> Aug 08, 2022 11:56 AM Haroldine Laws wrote: Reason for CRM: pr called asking about what meds he should be taking right now  CB#  [phone]

## 2022-08-09 MED ORDER — ENALAPRIL MALEATE 5 MG PO TABS: 5.0000 mg | ORAL_TABLET | Freq: Every day | ORAL | 0 refills | Status: DC

## 2022-08-09 NOTE — Telephone Encounter (Signed)
I called and spoke with patient. He needs a refill on enalapril. Refill sent into pharmacy.

## 2022-08-09 NOTE — Addendum Note (Signed)
Addended by: Benjiman Core on: 08/09/2022 10:03 AM   Modules accepted: Orders

## 2022-08-24 ENCOUNTER — Ambulatory Visit (INDEPENDENT_AMBULATORY_CARE_PROVIDER_SITE_OTHER): Payer: Medicare HMO | Admitting: Family Medicine

## 2022-08-24 VITALS — BP 142/92 | HR 68 | Temp 97.9°F | Ht 72.0 in | Wt 208.0 lb

## 2022-08-24 DIAGNOSIS — Z0000 Encounter for general adult medical examination without abnormal findings: Secondary | ICD-10-CM

## 2022-08-24 DIAGNOSIS — Z86010 Personal history of colonic polyps: Secondary | ICD-10-CM

## 2022-08-24 DIAGNOSIS — Z8739 Personal history of other diseases of the musculoskeletal system and connective tissue: Secondary | ICD-10-CM

## 2022-08-24 DIAGNOSIS — Z1211 Encounter for screening for malignant neoplasm of colon: Secondary | ICD-10-CM

## 2022-08-24 DIAGNOSIS — I10 Essential (primary) hypertension: Secondary | ICD-10-CM

## 2022-08-24 DIAGNOSIS — E782 Mixed hyperlipidemia: Secondary | ICD-10-CM

## 2022-08-24 DIAGNOSIS — S46912S Strain of unspecified muscle, fascia and tendon at shoulder and upper arm level, left arm, sequela: Secondary | ICD-10-CM

## 2022-08-24 MED ORDER — ALLOPURINOL 100 MG PO TABS: 100.0000 mg | ORAL_TABLET | Freq: Every day | ORAL | 3 refills | Status: DC

## 2022-08-24 NOTE — Patient Instructions (Signed)
Please review the attached list of medications and notify my office if there are any errors.   Call [facility] at [phone] to schedule your colonoscopy 

## 2022-08-24 NOTE — Progress Notes (Signed)
[name],acting as a scribe for [name], MD.,have documented all relevant documentation on the behalf of [name], MD,as directed by  [name], MD    Complete Physical Exam      Patient: Anthony Hood, Male    DOB: 1960/09/21, 62 y.o.   MRN: [id number] Visit Date: 08/24/2022  Today's Provider: [name], MD   No chief complaint on file.  Subjective    Anthony Hood is a 62 y.o. male who presents today for his complete physical examination. He reports consuming a general diet. The patient has a physically strenuous job, but has no regular exercise apart from work.  He generally feels well. He reports sleeping well. He does not have additional problems to discuss today.   Patient has history of tubular adenoma on colonoscopy on 07/20/2015.  With recommendation for 5 year follow up.    Patient also complains of some intermittent left shoulder pain.  He notes a history of injury playing basketball in his 80's.  He reports having it injected in the past with relief.He states tylenol will usually take care of pain  He is also here to follow up on lipids since changing from pravastatin to rosuvastatin after labs checked in April. He states he is taking consistently and not noticed any side effects.   Lab Results  Component Value Date   CHOL 246 (H) 06/28/2022   HDL 65 06/28/2022   LDLCALC 156 (H) 06/28/2022   LDLDIRECT 155 (H) 06/28/2022   TRIG 139 06/28/2022   CHOLHDL 3.7 04/20/2021   Lab Results  Component Value Date   PSA1 0.7 06/28/2022   PSA1 0.6 04/20/2021   PSA1 0.6 09/05/2019   Last metabolic panel Lab Results  Component Value Date   GLUCOSE 112 (H) 06/28/2022   NA 139 06/28/2022   K 4.3 06/28/2022   CL 103 06/28/2022   CO2 22 06/28/2022   BUN 13 06/28/2022   CREATININE 1.18 06/28/2022   EGFR 70 06/28/2022   CALCIUM 9.2 06/28/2022   PROT 6.9 06/28/2022   ALBUMIN 4.2 06/28/2022   LABGLOB 2.7 06/28/2022   AGRATIO 1.6 06/28/2022    BILITOT 0.3 06/28/2022   ALKPHOS 76 06/28/2022   AST 24 06/28/2022   ALT 34 06/28/2022     Medications: Outpatient Medications Prior to Visit  Medication Sig   enalapril (VASOTEC) 5 MG tablet Take 1 tablet (5 mg total) by mouth daily.   rosuvastatin (CRESTOR) 10 MG tablet Take 1 tablet (10 mg total) by mouth daily.   allopurinol (ZYLOPRIM) 100 MG tablet Take 1 tablet (100 mg total) by mouth daily. (Patient not taking: Reported on 08/24/2022)   No facility-administered medications prior to visit.    No Known Allergies  Patient Care Team: Malva Limes, MD as PCP - General (Family Medicine) Marlene Bast (Optometry) Kerri Perches, MD as Attending Physician (Optometry) Norma Fredrickson, Boykin Nearing, MD as Consulting Physician (Gastroenterology)  Review of Systems  Constitutional: Negative.   HENT: Negative.    Eyes: Negative.   Respiratory: Negative.    Cardiovascular: Negative.   Gastrointestinal: Negative.   Endocrine: Negative.   Genitourinary: Negative.   Musculoskeletal: Negative.   Skin: Negative.   Allergic/Immunologic: Negative.   Neurological: Negative.   Hematological: Negative.   Psychiatric/Behavioral: Negative.          Objective    Vitals: BP (!) 142/92 (BP Location: Right Arm, Patient Position: Sitting, Cuff Size: Normal)   Pulse 68   Temp 97.9 F (36.6  C) (Oral)   Ht 6' (1.829 m)   Wt 208 lb (94.3 kg)   SpO2 99%   BMI 28.21 kg/m  Vitals:   08/24/22 0902 08/24/22 0906  BP: (!) 142/92 (!) 142/92  Pulse: 68   Temp: 97.9 F (36.6 C)   TempSrc: Oral   SpO2: 99%   Weight: 208 lb (94.3 kg)   Height: 6' (1.829 m)     Physical Exam  General Appearance:    Well developed, well nourished male. Alert, cooperative, in no acute distress, appears stated age  Head:    Normocephalic, without obvious abnormality, atraumatic  Eyes:    PERRL, conjunctiva/corneas clear, EOM's intact, fundi    benign, both eyes       Ears:    Normal TM's and external ear  canals, both ears  Nose:   Nares normal, septum midline, mucosa normal, no drainage   or sinus tenderness  Throat:   Lips, mucosa, and tongue normal; teeth and gums normal  Neck:   Supple, symmetrical, trachea midline, no adenopathy;       thyroid:  No enlargement/tenderness/nodules; no carotid   bruit or JVD  Back:     Symmetric, no curvature, ROM normal, no CVA tenderness  Lungs:     Clear to auscultation bilaterally, respirations unlabored  Chest wall:    No tenderness or deformity  Heart:    Normal heart rate. Normal rhythm. No murmurs, rubs, or gallops.  S1 and S2 normal  Abdomen:     Soft, non-tender, bowel sounds active all four quadrants,    no masses, no organomegaly  Genitalia:    deferred  Rectal:    deferred  Extremities:   All extremities are intact. No cyanosis or edema. Tender left upper outer scapula, pain reproduced by internal shoulder rotation.   Pulses:   2+ and symmetric all extremities  Skin:   Skin color, texture, turgor normal, no rashes or lesions  Lymph nodes:   Cervical, supraclavicular, and axillary nodes normal  Neurologic:   CNII-XII intact. Normal strength, sensation and reflexes      throughout     Assessment & Plan    1. Annual physical exam   2. History of adenomatous polyp of colon   3. Colon cancer screening  - Ambulatory referral to Gastroenterology  4. Mixed hyperlipidemia Tolerating recent change to rosuvastatin well.   - Lipid panel - Comprehensive metabolic panel  5. Strain of left shoulder, sequela Remote sports injury flares from time to time. Can apply heat/ice for flares. Tylenol OK prn. Advised he can call for ortho referral as he responded well to steroid injection in the past.   6. History of gout Off allopurinol last few years, but states he has minor flares in toe from time to time.   - Uric acid  Restart allopurinol (ZYLOPRIM) 100 MG tablet; Take 1 tablet (100 mg total) by mouth daily.  Dispense: 90 tablet; Refill: 3.  Counseled initiation of allopurinol may trigger flare for which he can take OTC ibuprofen.   7. Primary hypertension Did not take his medication today. Advised to check home BP and let me know if consistently >140 or >90 - EKG 12-Lead     The entirety of the information documented in the History of Present Illness, Review of Systems and Physical Exam were personally obtained by me. Portions of this information were initially documented by the CMA and reviewed by me for thoroughness and accuracy.     [name], MD  [facility] [phone] (phone) [phone] (fax)  [facility]

## 2022-08-24 NOTE — Progress Notes (Signed)
Annual Wellness Visit     Patient: Anthony Hood, Male    DOB: 09/20/1960, 62 y.o.   MRN: [id number] Visit Date: 08/24/2022  Today's Provider: [name], MD   No chief complaint on file.  Subjective    Anthony Hood is a 62 y.o. male who presents today for his Annual Wellness Visit.   Medications: Outpatient Medications Prior to Visit  Medication Sig   enalapril (VASOTEC) 5 MG tablet Take 1 tablet (5 mg total) by mouth daily.   rosuvastatin (CRESTOR) 10 MG tablet Take 1 tablet (10 mg total) by mouth daily.   allopurinol (ZYLOPRIM) 100 MG tablet Take 1 tablet (100 mg total) by mouth daily. (Patient not taking: Reported on 08/24/2022)   No facility-administered medications prior to visit.    No Known Allergies  Patient Care Team: Malva Limes, MD as PCP - General (Family Medicine) Marlene Bast [facility]) Kerri Perches, MD as Attending Physician (Optometry) Stanton Kidney, MD as Consulting Physician (Gastroenterology)        Objective      Most recent functional status assessment:    08/24/2022    9:01 AM  In your present state of health, do you have any difficulty performing the following activities:  Hearing? 0  Vision? 0  Difficulty concentrating or making decisions? 0  Walking or climbing stairs? 0  Dressing or bathing? 0  Doing errands, shopping? 0   Most recent fall risk assessment:    08/24/2022    9:01 AM  Fall Risk   Falls in the past year? 0  Number falls in past yr: 0  Injury with Fall? 0    Most recent depression screenings:    08/24/2022    9:01 AM 06/28/2022    8:45 AM  PHQ 2/9 Scores  PHQ - 2 Score 0 0  PHQ- 9 Score 0 0   Most recent cognitive screening:     No data to display         Most recent Audit-C alcohol use screening    08/24/2022    9:01 AM  Alcohol Use Disorder Test (AUDIT)  1. How often do you have a drink containing alcohol? 1  2. How many drinks containing alcohol do you have on a typical  day when you are drinking? 0  3. How often do you have six or more drinks on one occasion? 2  AUDIT-C Score 3   A score of 3 or more in women, and 4 or more in men indicates increased risk for alcohol abuse, EXCEPT if all of the points are from question 1   No results found for any visits on 08/24/22.  Assessment & Plan     Annual wellness visit done today including the all of the following: Reviewed patient's Family Medical History Reviewed and updated list of patient's medical providers Assessment of cognitive impairment was done Assessed patient's functional ability Established a written schedule for health screening services Health Risk Assessent Completed and Reviewed  Exercise Activities and Dietary recommendations  Goals      DIET - INCREASE LEAN PROTEINS     Recommend to cut back on red meats and eat more chicken, [place] or fish.         Immunization History  Administered Date(s) Administered   Influenza Split 01/18/2010   Influenza,inj,Quad PF,6+ Mos 01/03/2013, 12/16/2013, 05/19/2016, 01/09/2018, 02/08/2019, 03/25/2020, 04/20/2021   PFIZER(Purple Top)SARS-COV-2 Vaccination 07/01/2019, 07/24/2019, 03/25/2020   Tdap 05/19/2016    Health Maintenance  Topic Date Due   Zoster Vaccines- Shingrix (1 of 2) Never done   Colonoscopy  07/19/2020   COVID-19 Vaccine (4 - 2023-24 season) 11/19/2021   INFLUENZA VACCINE  10/20/2022   Medicare Annual Wellness (AWV)  08/24/2023   DTaP/Tdap/Td (2 - Td or Tdap) 05/20/2026   Hepatitis C Screening  Completed   HIV Screening  Completed   HPV VACCINES  Aged Out     Discussed health benefits of physical activity, and encouraged him to engage in regular exercise appropriate for his age and condition.   No follow-ups on file.      [name], MD  [facility] [phone] (phone) [phone] (fax)  [facility]

## 2022-08-25 LAB — COMPREHENSIVE METABOLIC PANEL
ALT: 34 IU/L (ref 0–44)
AST: 30 IU/L (ref 0–40)
Albumin/Globulin Ratio: 1.7 (ref 1.2–2.2)
Albumin: 4.4 g/dL (ref 3.9–4.9)
Alkaline Phosphatase: 66 IU/L (ref 44–121)
BUN/Creatinine Ratio: 9 — ABNORMAL LOW (ref 10–24)
BUN: 11 mg/dL (ref 8–27)
Bilirubin Total: 0.4 mg/dL (ref 0.0–1.2)
CO2: 24 mmol/L (ref 20–29)
Calcium: 9.8 mg/dL (ref 8.6–10.2)
Chloride: 104 mmol/L (ref 96–106)
Creatinine, Ser: 1.16 mg/dL (ref 0.76–1.27)
Globulin, Total: 2.6 g/dL (ref 1.5–4.5)
Glucose: 97 mg/dL (ref 70–99)
Potassium: 4.5 mmol/L (ref 3.5–5.2)
Sodium: 140 mmol/L (ref 134–144)
Total Protein: 7 g/dL (ref 6.0–8.5)
eGFR: 71 mL/min/{1.73_m2} (ref >59)

## 2022-08-25 LAB — LIPID PANEL
Chol/HDL Ratio: 3.6 ratio (ref 0.0–5.0)
Cholesterol, Total: 242 mg/dL — ABNORMAL HIGH (ref 100–199)
HDL: 67 mg/dL (ref >39)
LDL Chol Calc (NIH): 157 mg/dL — ABNORMAL HIGH (ref 0–99)
Triglycerides: 102 mg/dL (ref 0–149)
VLDL Cholesterol Cal: 18 mg/dL (ref 5–40)

## 2022-08-25 LAB — URIC ACID: Uric Acid: 7.4 mg/dL (ref 3.8–8.4)

## 2022-09-05 ENCOUNTER — Encounter: Payer: Self-pay | Admitting: *Deleted

## 2022-09-13 ENCOUNTER — Telehealth: Payer: Self-pay

## 2022-09-13 ENCOUNTER — Other Ambulatory Visit: Payer: Self-pay

## 2022-09-13 DIAGNOSIS — Z860100 Personal history of colon polyps, unspecified: Secondary | ICD-10-CM

## 2022-09-13 MED ORDER — NA SULFATE-K SULFATE-MG SULF 17.5-3.13-1.6 GM/177ML PO SOLN: 1.0000 | Freq: Once | ORAL | 0 refills | Status: AC

## 2022-09-13 NOTE — Telephone Encounter (Signed)
Gastroenterology Pre-Procedure Review  Request Date: 11/02/22 Requesting Physician: Dr. Tobi Bastos  PATIENT REVIEW QUESTIONS: The patient responded to the following health history questions as indicated:    1. Are you having any GI issues? no 2. Do you have a personal history of Polyps? Yes last colonoscopy was with Dr. Mechele Collin 07/2015 3. Do you have a family history of Colon Cancer or Polyps? no 4. Diabetes Mellitus? no 5. Joint replacements in the past 12 months?no 6. Major health problems in the past 3 months?no 7. Any artificial heart valves, MVP, or defibrillator?no    MEDICATIONS & ALLERGIES:    Patient reports the following regarding taking any anticoagulation/antiplatelet therapy:   Plavix, Coumadin, Eliquis, Xarelto, Lovenox, Pradaxa, Brilinta, or Effient? no Aspirin? no  Patient confirms/reports the following medications:  Current Outpatient Medications  Medication Sig Dispense Refill   allopurinol (ZYLOPRIM) 100 MG tablet Take 1 tablet (100 mg total) by mouth daily. 90 tablet 3   enalapril (VASOTEC) 5 MG tablet Take 1 tablet (5 mg total) by mouth daily. 90 tablet 0   rosuvastatin (CRESTOR) 10 MG tablet Take 1 tablet (10 mg total) by mouth daily. 90 tablet 3   No current facility-administered medications for this visit.    Patient confirms/reports the following allergies:  No Known Allergies  No orders of the defined types were placed in this encounter.   AUTHORIZATION INFORMATION Primary Insurance: 1D#: Group #:  Secondary Insurance: 1D#: Group #:  SCHEDULE INFORMATION: Date: 11/02/22 Time: Location: [facility]

## 2022-09-23 ENCOUNTER — Other Ambulatory Visit: Payer: Self-pay | Admitting: Family Medicine

## 2022-09-23 DIAGNOSIS — E782 Mixed hyperlipidemia: Secondary | ICD-10-CM

## 2022-09-23 MED ORDER — ROSUVASTATIN CALCIUM 20 MG PO TABS: 20.0000 mg | ORAL_TABLET | Freq: Every day | ORAL | 2 refills | Status: DC

## 2022-10-05 ENCOUNTER — Telehealth: Payer: Self-pay | Admitting: Gastroenterology

## 2022-10-05 NOTE — Telephone Encounter (Signed)
Patient and his wife (Tammy) called in to check on the date of his procedure and requested that instruction. The patient stated if we can't get him call his wife and this is her new number [phone] [facility]).

## 2022-10-26 ENCOUNTER — Telehealth: Payer: Self-pay

## 2022-10-26 NOTE — Telephone Encounter (Signed)
Copied from CRM [id number]. Topic: General - Other >> Oct 26, 2022  3:08 PM Franchot Heidelberg wrote: Reason for CRM: Pt called and reports that he cannot afford the supplies he has been asked to purchase prior to his colonoscopy 11/02/2022. He is asking for assistance because he says his insurance should cover it.

## 2022-10-27 ENCOUNTER — Telehealth: Payer: Self-pay | Admitting: Gastroenterology

## 2022-10-27 ENCOUNTER — Encounter: Payer: Self-pay | Admitting: Gastroenterology

## 2022-10-27 ENCOUNTER — Telehealth: Payer: Self-pay

## 2022-10-27 MED ORDER — GOLYTELY 236 G PO SOLR: 4000.0000 mL | Freq: Once | ORAL | 0 refills | Status: AC

## 2022-10-27 NOTE — Telephone Encounter (Signed)
Pt left vm message to see if he could get a cheaper prep or samples for procedure in 11/02/2022

## 2022-10-27 NOTE — Telephone Encounter (Signed)
Advised patient by message to contact GI office regarding his prep

## 2022-10-27 NOTE — Telephone Encounter (Signed)
Copied from CRM [id number]. Topic: General - Other >> Oct 27, 2022 10:01 AM Macon Large wrote: Reason for CRM: Pt reports that he has not heard back from anyone and he can not afford the supplies for the colonoscopy on 11/02/22. Pt requests call back to discuss.

## 2022-10-27 NOTE — Telephone Encounter (Signed)
Attempted to contact patient to let him know prep has changed from Suprep to Golytely but no one answered and no voicemail has been set up. Golytely has been sent to [facility] on [address]  Thanks,  [place], [place]

## 2022-11-02 ENCOUNTER — Ambulatory Visit: Payer: Medicare HMO | Admitting: Certified Registered"

## 2022-11-02 ENCOUNTER — Encounter: Payer: Self-pay | Admitting: Gastroenterology

## 2022-11-02 ENCOUNTER — Ambulatory Visit
Admission: RE | Admit: 2022-11-02 | Discharge: 2022-11-02 | Disposition: A | Discharge status: Home / Self Care | Payer: Medicare HMO | Attending: Gastroenterology | Admitting: Gastroenterology

## 2022-11-02 ENCOUNTER — Encounter
Admission: RE | Disposition: A | Discharge status: Home / Self Care | Payer: Self-pay | Source: Home / Self Care | Attending: Gastroenterology

## 2022-11-02 DIAGNOSIS — Z86010 Personal history of colonic polyps: Secondary | ICD-10-CM | POA: Diagnosis not present

## 2022-11-02 DIAGNOSIS — Z1211 Encounter for screening for malignant neoplasm of colon: Secondary | ICD-10-CM | POA: Insufficient documentation

## 2022-11-02 HISTORY — PX: COLONOSCOPY WITH PROPOFOL: SHX5780

## 2022-11-02 SURGERY — COLONOSCOPY WITH PROPOFOL
Anesthesia: General

## 2022-11-02 MED ORDER — LIDOCAINE HCL (CARDIAC) PF 100 MG/5ML IV SOSY
PREFILLED_SYRINGE | INTRAVENOUS | Status: DC | PRN
  Administered 2022-11-02: 100 mg via INTRAVENOUS

## 2022-11-02 MED ORDER — PROPOFOL 500 MG/50ML IV EMUL
INTRAVENOUS | Status: DC | PRN
  Administered 2022-11-02: 165 ug/kg/min via INTRAVENOUS

## 2022-11-02 MED ORDER — PROPOFOL 10 MG/ML IV BOLUS
INTRAVENOUS | Status: DC | PRN
  Administered 2022-11-02: 70 mg via INTRAVENOUS

## 2022-11-02 MED ORDER — SODIUM CHLORIDE 0.9 % IV SOLN
INTRAVENOUS | Status: DC
  Administered 2022-11-02: 1000 mL via INTRAVENOUS

## 2022-11-02 NOTE — Transfer of Care (Signed)
Immediate Anesthesia Transfer of Care Note  Patient: Anthony Hood  Procedure(s) Performed: COLONOSCOPY WITH PROPOFOL  Patient Location: Endoscopy Unit  Anesthesia Type:General  Level of Consciousness: drowsy and patient cooperative  Airway & Oxygen Therapy: Patient Spontanous Breathing and Patient connected to face mask oxygen  Post-op Assessment: Report given to RN and Post -op Vital signs reviewed and stable  Post vital signs: Reviewed and stable  Last Vitals:  Vitals Value Taken Time  BP 112/89 11/02/22 0817  Temp 35.6 C 11/02/22 0816  Pulse 62 11/02/22 0820  Resp 19 11/02/22 0820  SpO2 100 % 11/02/22 0820  Vitals shown include unfiled device data.  Last Pain:  Vitals:   11/02/22 0816  TempSrc: Temporal  PainSc: Asleep         Complications: No notable events documented.

## 2022-11-02 NOTE — Anesthesia Preprocedure Evaluation (Signed)
Anesthesia Evaluation  Patient identified by MRN, date of birth, ID band Patient awake    Reviewed: Allergy & Precautions, H&P , NPO status , Patient's Chart, lab work & pertinent test results, reviewed documented beta blocker date and time   Airway Mallampati: II   Neck ROM: full    Dental  (+) Poor Dentition   Pulmonary neg pulmonary ROS   Pulmonary exam normal        Cardiovascular Exercise Tolerance: Good hypertension, On Medications negative cardio ROS Normal cardiovascular exam Rhythm:regular Rate:Normal     Neuro/Psych negative neurological ROS  negative psych ROS   GI/Hepatic negative GI ROS, Neg liver ROS,,,  Endo/Other  negative endocrine ROS    Renal/GU negative Renal ROS  negative genitourinary   Musculoskeletal   Abdominal   Peds  Hematology negative hematology ROS (+)   Anesthesia Other Findings Past Medical History: 08/21/2018: Gout involving toe of left foot No date: Hyperlipidemia No date: Hypertension Past Surgical History: 07/20/2015: COLONOSCOPY WITH PROPOFOL; N/A     Comment:  Procedure: COLONOSCOPY WITH PROPOFOL;  Surgeon: Scot Jun, MD;  Location: [facility] ENDOSCOPY;  Service:               Endoscopy;  Laterality: N/A; No date: HERNIA REPAIR     Comment:  umbilical No date: SHOULDER SURGERY BMI    Body Mass Index: 27.10 kg/m     Reproductive/Obstetrics negative OB ROS                             Anesthesia Physical Anesthesia Plan  ASA: 2  Anesthesia Plan: General   Post-op Pain Management:    Induction:   PONV Risk Score and Plan:   Airway Management Planned:   Additional Equipment:   Intra-op Plan:   Post-operative Plan:   Informed Consent: I have reviewed the patients History and Physical, chart, labs and discussed the procedure including the risks, benefits and alternatives for the proposed anesthesia with the patient or  authorized representative who has indicated his/her understanding and acceptance.     Dental Advisory Given  Plan Discussed with: CRNA  Anesthesia Plan Comments:        Anesthesia Quick Evaluation

## 2022-11-02 NOTE — H&P (Signed)
Anthony Mood, MD [address], [place], [place], [address] [address], [place], [place], [address] Phone: [phone]  Fax: [phone]  Primary Care Physician:  Malva Limes, MD   Pre-Procedure History & Physical: HPI:  Anthony Hood is a 62 y.o. male is here for an colonoscopy.   Past Medical History:  Diagnosis Date   Gout involving toe of left foot 08/21/2018   Hyperlipidemia    Hypertension     Past Surgical History:  Procedure Laterality Date   COLONOSCOPY WITH PROPOFOL N/A 07/20/2015   Procedure: COLONOSCOPY WITH PROPOFOL;  Surgeon: Scot Jun, MD;  Location[facility] ENDOSCOPY;  Service: Endoscopy;  Laterality: N/A;   HERNIA REPAIR     umbilical   SHOULDER SURGERY      Prior to Admission medications   Medication Sig Start Date End Date Taking? Authorizing Provider  allopurinol (ZYLOPRIM) 100 MG tablet Take 1 tablet (100 mg total) by mouth daily. 08/24/22  Yes Malva Limes, MD  enalapril (VASOTEC) 5 MG tablet Take 1 tablet (5 mg total) by mouth daily. 08/09/22  Yes Malva Limes, MD  rosuvastatin (CRESTOR) 20 MG tablet Take 1 tablet (20 mg total) by mouth daily. 09/23/22  Yes Malva Limes, MD    Allergies as of 09/13/2022   (No Known Allergies)    Family History  Problem Relation Age of Onset   COPD Mother    Heart disease Father    Hypertension Sister    Seizures Sister     Social History   Socioeconomic History   Marital status: Married    Spouse name: Not on file   Number of children: 0   Years of education: Not on file   Highest education level: High school graduate  Occupational History   Occupation: disability  Tobacco Use   Smoking status: Never   Smokeless tobacco: Never  Vaping Use   Vaping status: Never Used  Substance and Sexual Activity   Alcohol use: Yes    Alcohol/week: 1.0 - 2.0 standard drink of alcohol    Types: 1 - 2 Cans of beer per week   Drug use: No   Sexual activity: Not on file  Other  Topics Concern   Not on file  Social History Narrative   2 stepchildren   Social Determinants of Health   Financial Resource Strain: Low Risk  (09/26/2019)   Overall Financial Resource Strain (CARDIA)    Difficulty of Paying Living Expenses: Not very hard  Food Insecurity: No Food Insecurity (09/26/2019)   Hunger Vital Sign    Worried About Running Out of Food in the Last Year: Never true    Ran Out of Food in the Last Year: Never true  Transportation Needs: No Transportation Needs (09/26/2019)   PRAPARE - Administrator, Civil Service (Medical): No    Lack of Transportation (Non-Medical): No  Physical Activity: Inactive (09/26/2019)   Exercise Vital Sign    Days of Exercise per Week: 0 days    Minutes of Exercise per Session: 0 min  Stress: No Stress Concern Present (09/26/2019)   [employer] of Occupational Health - Occupational Stress Questionnaire    Feeling of Stress : Not at all  Social Connections: Moderately Integrated (09/26/2019)   Social Connection and Isolation Panel [NHANES]    Frequency of Communication with Friends and Family: More than three times a week    Frequency of Social Gatherings with Friends and Family: More than three  times a week    Attends Religious Services: More than 4 times per year    Active Member of Clubs or Organizations: No    Attends Banker Meetings: Never    Marital Status: Married  Catering manager Violence: Not At Risk (09/26/2019)   Humiliation, Afraid, Rape, and Kick questionnaire    Fear of Current or Ex-Partner: No    Emotionally Abused: No    Physically Abused: No    Sexually Abused: No    Review of Systems: See HPI, otherwise negative ROS  Physical Exam: BP (!) 139/90   Pulse 67   Temp (!) 96.6 F (35.9 C) (Temporal)   Resp 18   Ht 6\' 1"  (1.854 m)   Wt 93.2 kg   SpO2 100%   BMI 27.10 kg/m  General:   Alert,  pleasant and cooperative in NAD Head:  Normocephalic and atraumatic. Neck:  Supple; no  masses or thyromegaly. Lungs:  Clear throughout to auscultation, normal respiratory effort.    Heart:  +S1, +S2, Regular rate and rhythm, No edema. Abdomen:  Soft, nontender and nondistended. Normal bowel sounds, without guarding, and without rebound.   Neurologic:  Alert and  oriented x4;  grossly normal neurologically.  Impression/Plan: Anthony Hood is here for an colonoscopy to be performed for surveillance due to prior history of colon polyps   Risks, benefits, limitations, and alternatives regarding  colonoscopy have been reviewed with the patient.  Questions have been answered.  All parties agreeable.   Anthony Mood, MD  11/02/2022, 7:50 AM

## 2022-11-02 NOTE — Anesthesia Procedure Notes (Signed)
Procedure Name: General with mask airway Date/Time: 11/02/2022 8:12 AM  Performed by: Mohammed Kindle, CRNAPre-anesthesia Checklist: Patient identified, Emergency Drugs available, Suction available and Patient being monitored Patient Re-evaluated:Patient Re-evaluated prior to induction Oxygen Delivery Method: Simple face mask Induction Type: IV induction Placement Confirmation: positive ETCO2, CO2 detector and breath sounds checked- equal and bilateral Dental Injury: Teeth and Oropharynx as per pre-operative assessment

## 2022-11-02 NOTE — Op Note (Signed)
[facility]astroenterology Patient Name: Anthony Hood Procedure Date: 11/02/2022 7:58 AM MRN: [id number] Account #: [id number] Date of Birth: 19-Jun-1960 Admit Type: Outpatient Age: 62 Room: [facility] ENDO ROOM 3 Gender: Male Note Status: Finalized Instrument Name: Prentice Docker [id number] Procedure:             Colonoscopy Indications:           Surveillance: Personal history of adenomatous polyps                         on last colonoscopy > 5 years ago, Last colonoscopy:                         May 2017 Providers:             Wyline Mood MD, MD Referring MD:          Demetrios Isaacs. Sherrie Mustache, MD (Referring MD) Medicines:             Monitored Anesthesia Care Complications:         No immediate complications. Procedure:             Pre-Anesthesia Assessment:                        - Prior to the procedure, a History and Physical was                         performed, and patient medications, allergies and                         sensitivities were reviewed. The patient's tolerance                         of previous anesthesia was reviewed.                        - The risks and benefits of the procedure and the                         sedation options and risks were discussed with the                         patient. All questions were answered and informed                         consent was obtained.                        - ASA Grade Assessment: II - A patient with mild                         systemic disease.                        After obtaining informed consent, the colonoscope was                         passed under direct vision. Throughout the procedure,                         the patient's  blood pressure, pulse, and oxygen                         saturations were monitored continuously. The                         Colonoscope was introduced through the anus with the                         intention of advancing to the cecum. The scope was                          advanced to the sigmoid colon before the procedure was                         aborted. Medications were given. The colonoscopy was                         performed with ease. The patient tolerated the                         procedure well. The quality of the bowel preparation                         was unsatisfactory. Findings:      Stool was found in the sigmoid colon, interfering with visualization. Impression:            - Preparation of the colon was unsatisfactory.                        - Stool in the sigmoid colon.                        - No specimens collected. Recommendation:        - Discharge patient to home (with escort).                        - Resume previous diet.                        - Continue present medications.                        - Repeat colonoscopy in 4 weeks because the bowel                         preparation was suboptimal. Procedure Code(s):     --- Professional ---                        [id number], 53, Colonoscopy, flexible; diagnostic,                         including collection of specimen(s) by brushing or                         washing, when performed (separate procedure) Diagnosis Code(s):     --- Professional ---  Z86.010, Personal history of colonic polyps CPT copyright 2022 American Medical Association. All rights reserved. The codes documented in this report are preliminary and upon coder review may  be revised to meet current compliance requirements. Wyline Mood, MD Wyline Mood MD, MD 11/02/2022 8:15:06 AM This report has been signed electronically. Number of Addenda: 0 Note Initiated On: 11/02/2022 7:58 AM Total Procedure Duration: 0 hours 1 minute 44 seconds  Estimated Blood Loss:  Estimated blood loss: none.      [facility]

## 2022-11-03 ENCOUNTER — Encounter: Payer: Self-pay | Admitting: Gastroenterology

## 2022-11-03 ENCOUNTER — Other Ambulatory Visit: Payer: Self-pay | Admitting: Family Medicine

## 2022-11-03 DIAGNOSIS — I10 Essential (primary) hypertension: Secondary | ICD-10-CM

## 2022-11-09 NOTE — Anesthesia Postprocedure Evaluation (Signed)
Anesthesia Post Note  Patient: Anthony Hood  Procedure(s) Performed: COLONOSCOPY WITH PROPOFOL  Patient location during evaluation: PACU Anesthesia Type: General Level of consciousness: awake and alert Pain management: pain level controlled Vital Signs Assessment: post-procedure vital signs reviewed and stable Respiratory status: spontaneous breathing, nonlabored ventilation, respiratory function stable and patient connected to nasal cannula oxygen Cardiovascular status: blood pressure returned to baseline and stable Postop Assessment: no apparent nausea or vomiting Anesthetic complications: no   No notable events documented.   Last Vitals:  Vitals:   11/02/22 0828 11/02/22 0839  BP: 117/89 132/87  Pulse: 69 64  Resp: 18 11  Temp:    SpO2: 100% 100%    Last Pain:  Vitals:   11/02/22 0828  TempSrc:   PainSc: 0-No pain                 Yevette Edwards

## 2022-11-16 ENCOUNTER — Telehealth: Payer: Self-pay

## 2022-11-16 NOTE — Telephone Encounter (Signed)
Received message from [place], [place] at [facility] in regards to scheduling patient for repeat colonoscopy due to poor prep.  Informed her that I will contact patient today or tomorrow to schedule his repeat colonoscopy.  Thanks, [place], [place]

## 2022-11-17 NOTE — Telephone Encounter (Signed)
Left voice message for patient to call office back to schedule repeat colonoscopy with Dr. Tobi Bastos due to poor prep.  Thanks,  [place], [place]

## 2023-02-07 ENCOUNTER — Telehealth: Payer: Self-pay

## 2023-02-07 NOTE — Telephone Encounter (Signed)
Acey Lav called in to check to see if the patient needs another referral. The patient wants to schedule his colonoscopy and I said he will just have to call back in to reschedule.

## 2023-02-07 NOTE — Telephone Encounter (Signed)
Copied from CRM [id number]. Topic: Referral - Question >> Feb 07, 2023 10:23 AM Shon Hale wrote: Reason for CRM: Pt had colonoscopy scheduled a couple months ago that was cancelled. Patient looking to have colonoscopy r/s. Pt requesting Dr. Sherrie Mustache order colonoscopy again.

## 2023-02-13 ENCOUNTER — Ambulatory Visit: Payer: Medicare HMO | Admitting: Family Medicine

## 2023-02-20 ENCOUNTER — Ambulatory Visit (INDEPENDENT_AMBULATORY_CARE_PROVIDER_SITE_OTHER): Payer: Medicare HMO | Admitting: Family Medicine

## 2023-02-20 ENCOUNTER — Encounter: Payer: Self-pay | Admitting: Family Medicine

## 2023-02-20 VITALS — BP 130/84 | HR 67 | Ht 73.0 in | Wt 213.0 lb

## 2023-02-20 DIAGNOSIS — I10 Essential (primary) hypertension: Secondary | ICD-10-CM | POA: Diagnosis not present

## 2023-02-20 DIAGNOSIS — Z860100 Personal history of colon polyps, unspecified: Secondary | ICD-10-CM

## 2023-02-20 DIAGNOSIS — S46912S Strain of unspecified muscle, fascia and tendon at shoulder and upper arm level, left arm, sequela: Secondary | ICD-10-CM

## 2023-02-20 DIAGNOSIS — S46912D Strain of unspecified muscle, fascia and tendon at shoulder and upper arm level, left arm, subsequent encounter: Secondary | ICD-10-CM

## 2023-02-20 DIAGNOSIS — Z8739 Personal history of other diseases of the musculoskeletal system and connective tissue: Secondary | ICD-10-CM

## 2023-02-20 DIAGNOSIS — E782 Mixed hyperlipidemia: Secondary | ICD-10-CM

## 2023-02-20 NOTE — Progress Notes (Signed)
      Established patient visit   Patient: Anthony Hood   DOB: 01-Feb-1961   62 y.o. Male  MRN: [id number] Visit Date: 02/20/2023  Today's healthcare provider: [name], MD    Subjective    Discussed the use of AI scribe software for clinical note transcription with the patient, who gave verbal consent to proceed.  History of Present Illness   Miss Huneke, a patient with a history of hypertension, hyperlipidemia, and gout, presents for routine follow up and with intermittent shoulder pain that he describes as being located 'right up in this area.' The pain is not constant but comes and goes, sometimes lingering. He has not taken any medication for this pain but has expressed interest in a cortisone shot.  His blood pressure has been well-controlled and consistent with readings taken in the office. He manages his blood pressure by maintaining a balanced diet, including salads, and ensuring adequate hydration.  He reports occasional flare-ups of gout in his big toe, but these episodes have been less frequent recently. He continues to take allopurinol daily for gout management. He has a preference for flounder and occasionally consumes beer, but he is mindful of his diet and fluid intake to manage his gout.  He is also on rosuvastatin 20 mg for hyperlipidemia and has not reported any issues with this medication. He has not eaten prior to the current visit, allowing for cholesterol level checks.  He also mentions an incomplete colonoscopy procedure due to inadequate bowel preparation. He has attempted to reschedule the procedure multiple times but has faced difficulties in coordinating the appointment.       Medications: Outpatient Medications Prior to Visit  Medication Sig   allopurinol (ZYLOPRIM) 100 MG tablet Take 1 tablet (100 mg total) by mouth daily.   enalapril (VASOTEC) 5 MG tablet Take 1 tablet by mouth once daily   rosuvastatin (CRESTOR) 20 MG tablet Take 1 tablet (20 mg  total) by mouth daily.   No facility-administered medications prior to visit.   Review of Systems     Objective    BP 130/84 (BP Location: Left Arm, Patient Position: Sitting, Cuff Size: Large)   Pulse 67   Ht 6\' 1"  (1.854 m)   Wt 213 lb (96.6 kg)   SpO2 97%   BMI 28.10 kg/m    Physical Exam   MUSCULOSKELETAL: Shoulder tenderness upon palpation. Full range of motion in arms with ability to raise arms out to the side and all the way up without difficulty.    No results found for any visits on 02/20/23.  Assessment & Plan       Shoulder Pain Intermittent shoulder pain, likely tendonitis. No current treatment. -Refer to [facility] for evaluation and possible cortisone injection.  Hypertension Well-controlled with current regimen. -Continue current treatment.  Hyperlipidemia Doing well with increased dose rosuvastatin 20mg  daily. No reported side effects. -Check cholesterol levels today.  Gout Intermittent big toe pain. On Allopurinol daily. -Check uric acid levels today. -Continue Allopurinol and maintain hydration, moderate protein intake, and limited alcohol consumption.  Incomplete Colonoscopy Previous colonoscopy incomplete due to inadequate bowel preparation. -Resend referral for repeat colonoscopy. Patient to call if no contact within a few days.    No follow-ups on file.      [name], MD  [facility] [phone] (phone) [phone] (fax)  [facility]

## 2023-02-20 NOTE — Patient Instructions (Signed)
Please review the attached list of medications and notify my office if there are any errors.  ° °Call  GI at [phone]    °

## 2023-02-21 LAB — LIPID PANEL
Chol/HDL Ratio: 3.5 {ratio} (ref 0.0–5.0)
Cholesterol, Total: 218 mg/dL — ABNORMAL HIGH (ref 100–199)
HDL: 62 mg/dL (ref >39)
LDL Chol Calc (NIH): 141 mg/dL — ABNORMAL HIGH (ref 0–99)
Triglycerides: 87 mg/dL (ref 0–149)
VLDL Cholesterol Cal: 15 mg/dL (ref 5–40)

## 2023-02-21 LAB — COMPREHENSIVE METABOLIC PANEL
ALT: 47 [IU]/L — ABNORMAL HIGH (ref 0–44)
AST: 37 [IU]/L (ref 0–40)
Albumin: 4.4 g/dL (ref 3.9–4.9)
Alkaline Phosphatase: 70 [IU]/L (ref 44–121)
BUN/Creatinine Ratio: 10 (ref 10–24)
BUN: 10 mg/dL (ref 8–27)
Bilirubin Total: 0.6 mg/dL (ref 0.0–1.2)
CO2: 23 mmol/L (ref 20–29)
Calcium: 9.4 mg/dL (ref 8.6–10.2)
Chloride: 103 mmol/L (ref 96–106)
Creatinine, Ser: 1.01 mg/dL (ref 0.76–1.27)
Globulin, Total: 2.6 g/dL (ref 1.5–4.5)
Glucose: 100 mg/dL — ABNORMAL HIGH (ref 70–99)
Potassium: 4.4 mmol/L (ref 3.5–5.2)
Sodium: 139 mmol/L (ref 134–144)
Total Protein: 7 g/dL (ref 6.0–8.5)
eGFR: 84 mL/min/{1.73_m2} (ref >59)

## 2023-02-21 LAB — URIC ACID: Uric Acid: 7.1 mg/dL (ref 3.8–8.4)

## 2023-02-27 ENCOUNTER — Telehealth: Payer: Self-pay | Admitting: Family Medicine

## 2023-02-27 NOTE — Telephone Encounter (Signed)
Patient returned call to get lab results. Please advise patient.

## 2023-02-27 NOTE — Telephone Encounter (Signed)
Copied from CRM [id number]. Topic: General - Call Back - No Documentation >> Feb 27, 2023  9:05 AM Macon Large wrote: Reason for CRM: Pt stated that he had a missed call from the office so he was returning the call. Cb# [phone]

## 2023-03-01 ENCOUNTER — Encounter: Payer: Self-pay | Admitting: *Deleted

## 2023-03-23 ENCOUNTER — Other Ambulatory Visit: Payer: Self-pay | Admitting: Family Medicine

## 2023-03-23 DIAGNOSIS — E782 Mixed hyperlipidemia: Secondary | ICD-10-CM

## 2023-03-23 MED ORDER — ROSUVASTATIN CALCIUM 20 MG PO TABS
20.0000 mg | ORAL_TABLET | Freq: Every day | ORAL | 2 refills | Status: DC
Start: 2023-03-23 — End: 2023-04-04

## 2023-04-04 ENCOUNTER — Other Ambulatory Visit: Payer: Self-pay | Admitting: Family Medicine

## 2023-04-04 DIAGNOSIS — E782 Mixed hyperlipidemia: Secondary | ICD-10-CM

## 2023-04-04 MED ORDER — ROSUVASTATIN CALCIUM 20 MG PO TABS: 20.0000 mg | ORAL_TABLET | Freq: Every day | ORAL | 2 refills | Status: AC

## 2023-04-10 ENCOUNTER — Ambulatory Visit: Payer: Medicare Other | Admitting: Family Medicine

## 2023-04-21 ENCOUNTER — Ambulatory Visit: Payer: Medicare Other | Admitting: Family Medicine

## 2023-05-18 ENCOUNTER — Other Ambulatory Visit: Payer: Self-pay | Admitting: Family Medicine

## 2023-05-18 DIAGNOSIS — I10 Essential (primary) hypertension: Secondary | ICD-10-CM

## 2023-05-20 IMAGING — CR DG ELBOW COMPLETE 3+V*R*
1 series · 5 of 5 positions shown · non-contrast
Comparison: None.

CLINICAL DATA: Right elbow pain and tingling in the ulnar
distribution for 3-4 weeks. No known injury.

EXAM:
RIGHT ELBOW - COMPLETE 3+ VIEW

[Series 1: dg elbow complete right (3+view) · 0.14mm/px · 5 of 5 slices shown]
[im 1/5]
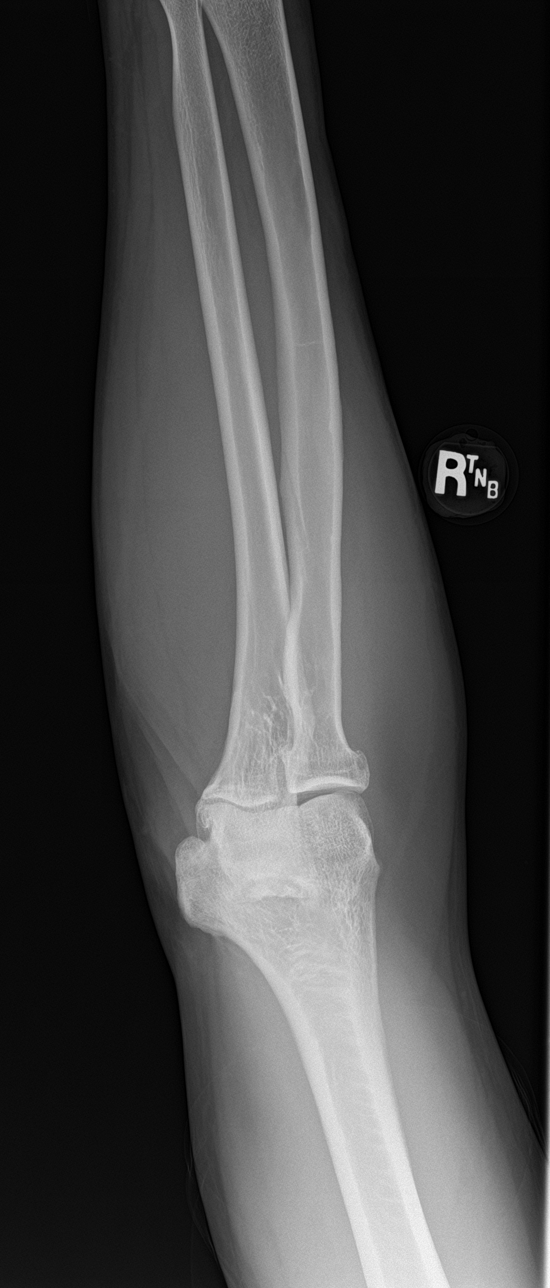
[im 2/5]
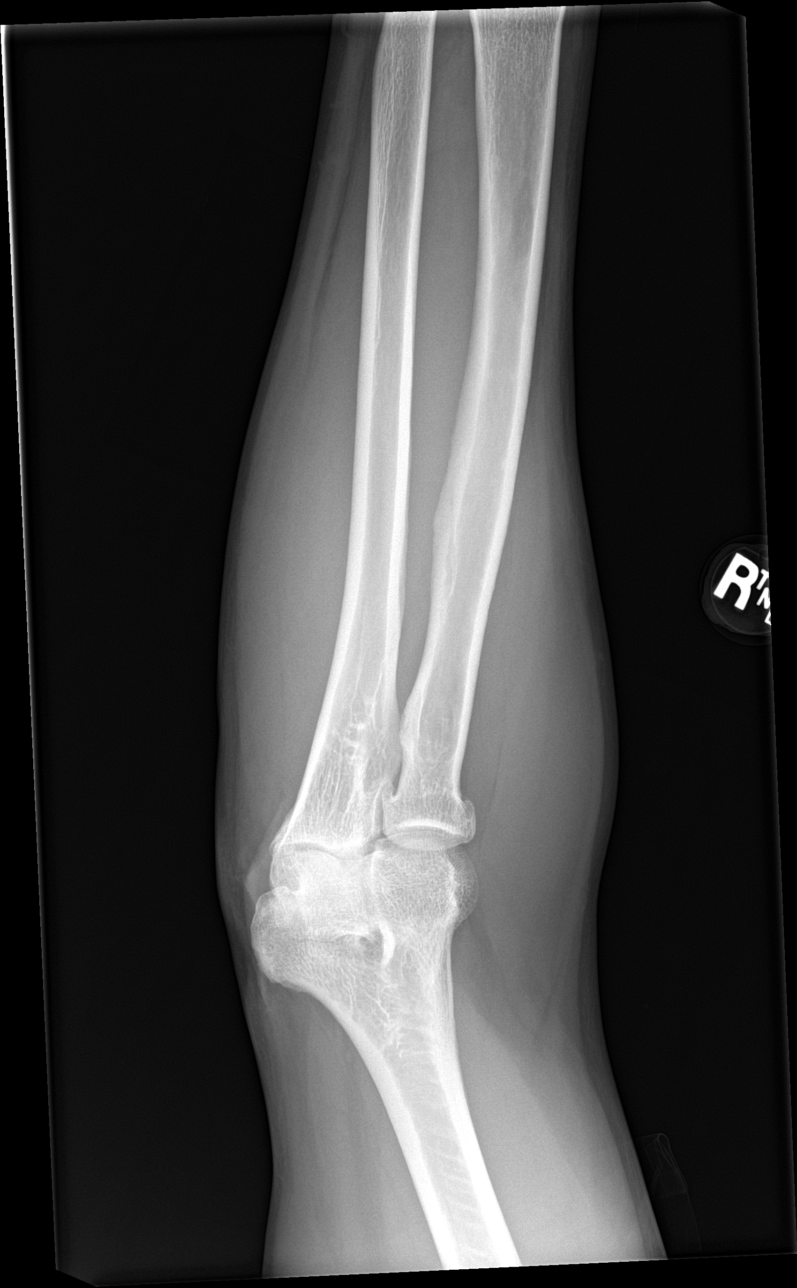
[im 3/5]
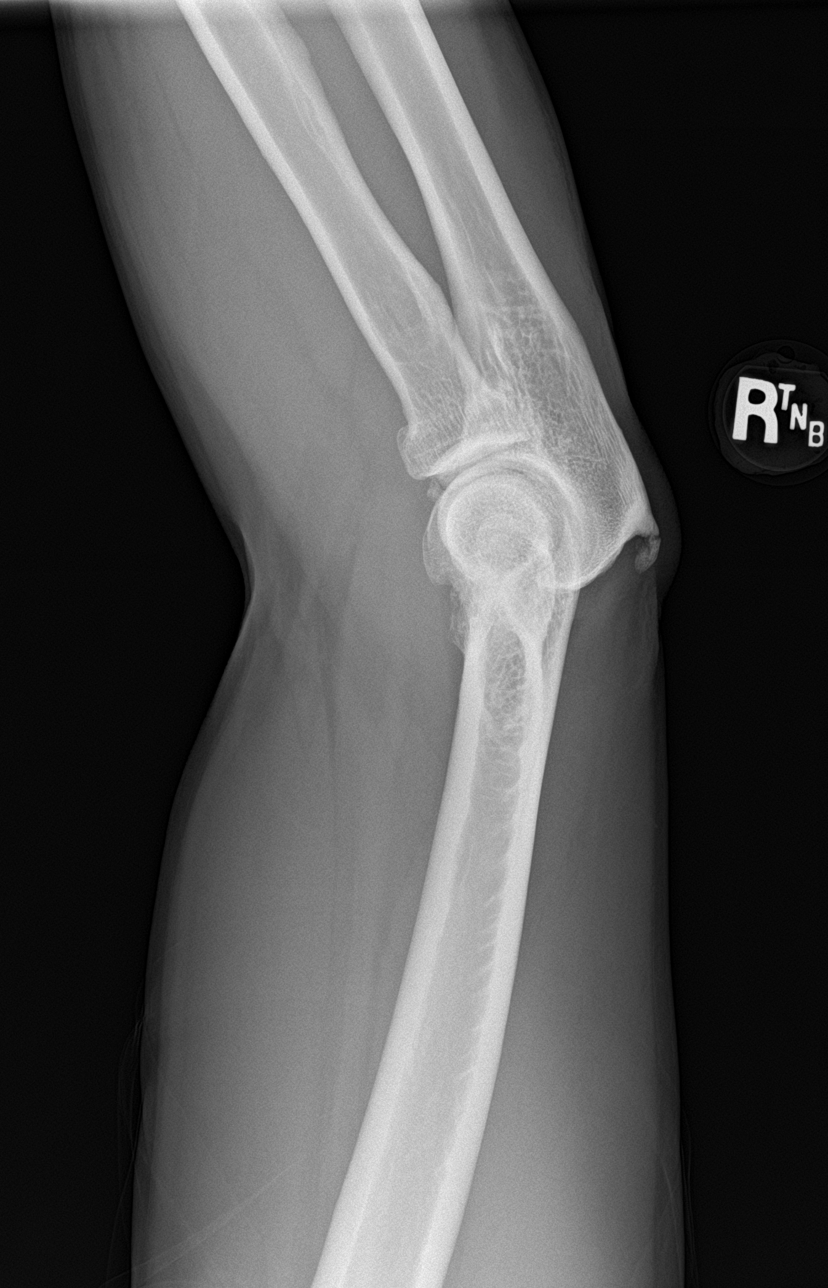
[im 4/5]
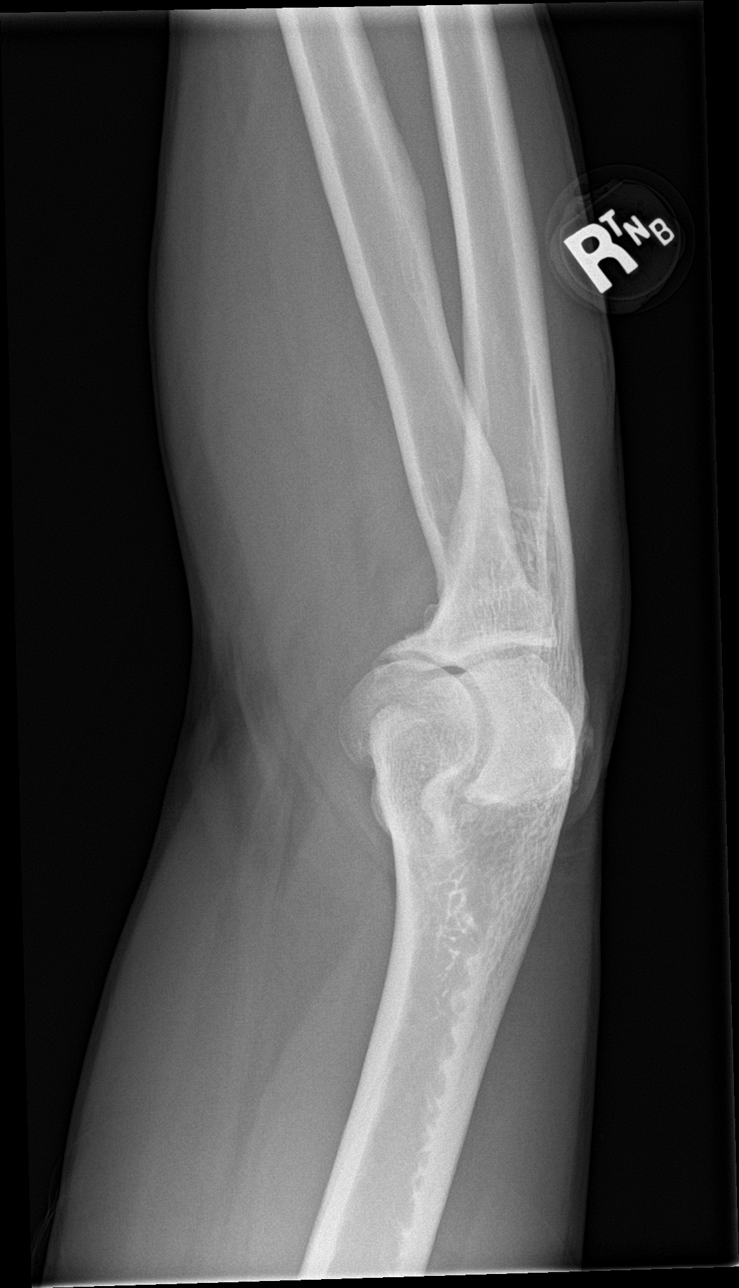
[im 5/5]
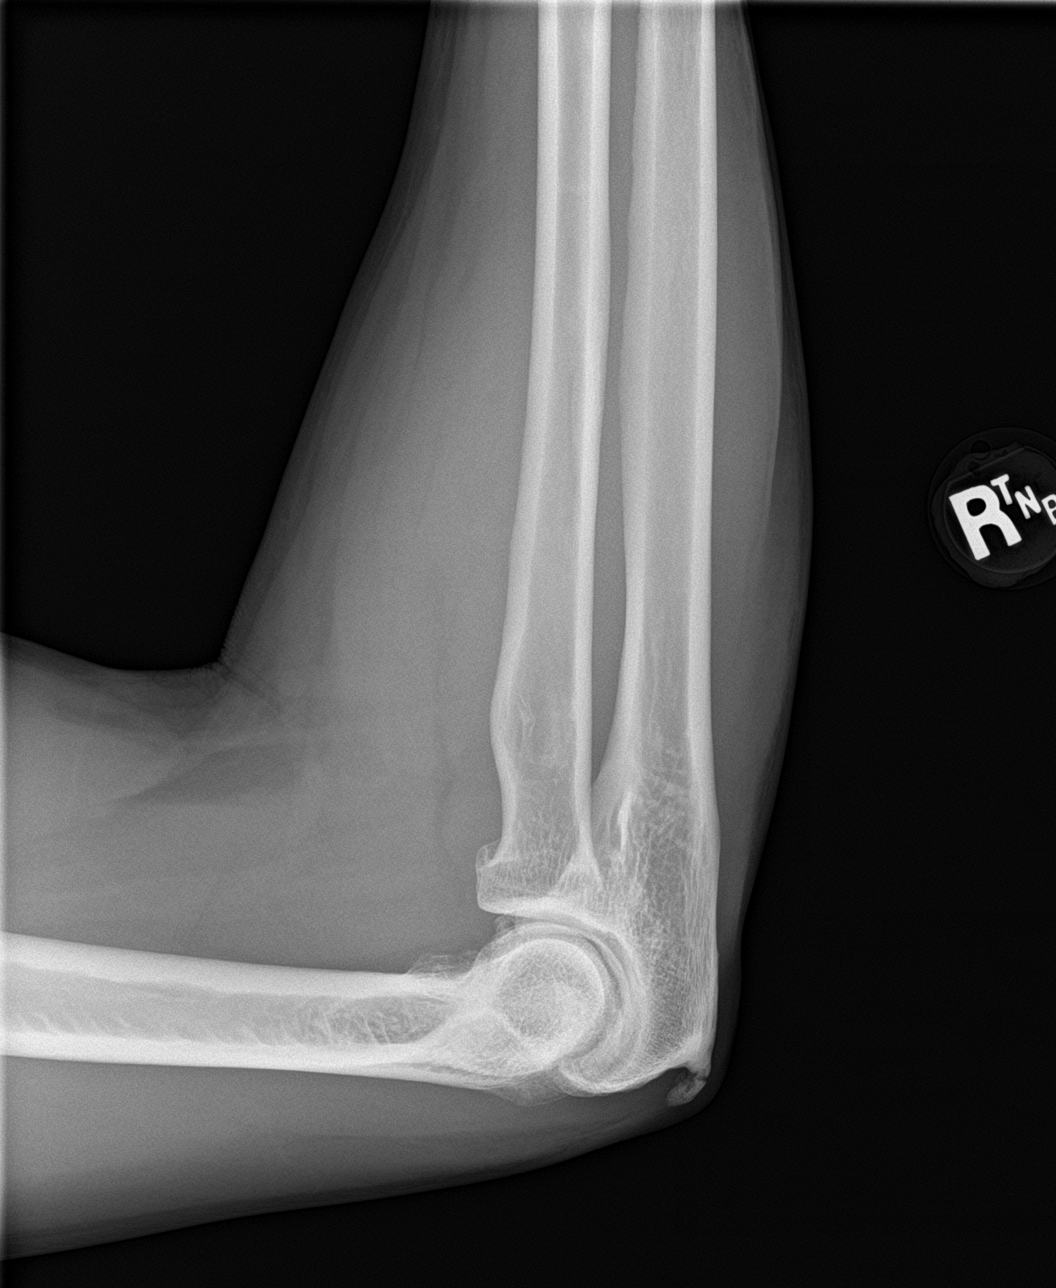

[5 of 5 positions shown; findings below may reference images not displayed]

FINDINGS: Mild spurring of the radial head. Chronically fractured spur noted
at the insertion of the triceps on the olecranon. Mild spurring of
the tip of the coronoid process. No soft tissue abnormality.
IMPRESSION: No acute fracture or dislocation of the right elbow. Degenerative
changes as discussed above.

## 2023-07-06 ENCOUNTER — Other Ambulatory Visit: Payer: Self-pay | Admitting: Family Medicine

## 2023-07-06 DIAGNOSIS — Z8739 Personal history of other diseases of the musculoskeletal system and connective tissue: Secondary | ICD-10-CM

## 2023-07-07 NOTE — Telephone Encounter (Signed)
 Requested medications are due for refill today.  A little too soon  Requested medications are on the active medications list.  yes  Last refill. 08/24/2022 #90 3 rf  Future visit scheduled.   no  Notes to clinic.  Labs are expired.    Requested Prescriptions  Pending Prescriptions Disp Refills   allopurinol  (ZYLOPRIM ) 100 MG tablet [Pharmacy Med Name: Allopurinol  100 MG Oral Tablet] 90 tablet 0    Sig: Take 1 tablet by mouth once daily     Endocrinology:  Gout Agents - allopurinol  Failed - 07/07/2023 10:55 AM      Failed - Valid encounter within last 12 months    Recent Outpatient Visits   None            Failed - CBC within normal limits and completed in the last 12 months    WBC  Date Value Ref Range Status  06/28/2022 7.3 3.4 - 10.8 x10E3/uL Final   RBC  Date Value Ref Range Status  06/28/2022 4.92 4.14 - 5.80 x10E6/uL Final   Hemoglobin  Date Value Ref Range Status  06/28/2022 14.1 13.0 - 17.7 g/dL Final   Hematocrit  Date Value Ref Range Status  06/28/2022 42.2 37.5 - 51.0 % Final   MCHC  Date Value Ref Range Status  06/28/2022 33.4 31.5 - 35.7 g/dL Final   [facility]  Date Value Ref Range Status  06/28/2022 28.7 26.6 - 33.0 pg Final   MCV  Date Value Ref Range Status  06/28/2022 86 79 - 97 fL Final   No results found for: "PLTCOUNTKUC", "LABPLAT", "POCPLA" RDW  Date Value Ref Range Status  06/28/2022 12.5 11.6 - 15.4 % Final         Passed - Uric Acid in normal range and within 360 days    Uric Acid  Date Value Ref Range Status  02/20/2023 7.1 3.8 - 8.4 mg/dL Final    Comment:               Therapeutic target for gout patients: <6.0         Passed - Cr in normal range and within 360 days    Creatinine, Ser  Date Value Ref Range Status  02/20/2023 1.01 0.76 - 1.27 mg/dL Final

## 2023-07-31 ENCOUNTER — Ambulatory Visit (LOCAL_COMMUNITY_HEALTH_CENTER): Payer: Self-pay

## 2023-07-31 DIAGNOSIS — Z9289 Personal history of other medical treatment: Secondary | ICD-10-CM

## 2023-07-31 NOTE — Progress Notes (Signed)
 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  Negative chest xray on 03/10/1982 and 11/10/1987. Patient cannot recall if he took LTBI meds.  "Record of Tuberculosis Screening" form completed and given to patient. Karthika Glasper, RN

## 2023-11-27 ENCOUNTER — Ambulatory Visit: Admitting: Family Medicine

## 2023-11-29 ENCOUNTER — Ambulatory Visit: Admitting: Family Medicine

## 2023-12-08 ENCOUNTER — Ambulatory Visit: Admitting: Physician Assistant
